# Patient Record
Sex: Female | Born: 1990 | Hispanic: No | Marital: Single
Health system: Midwestern US, Community
[De-identification: ages and names within clinical notes are randomized; demographics above are authoritative.]

## PROBLEM LIST (undated history)

## (undated) DIAGNOSIS — Z3A09 9 weeks gestation of pregnancy: Secondary | ICD-10-CM

## (undated) DIAGNOSIS — O3680X0 Pregnancy with inconclusive fetal viability, not applicable or unspecified: Secondary | ICD-10-CM

## (undated) DIAGNOSIS — O24419 Gestational diabetes mellitus in pregnancy, unspecified control: Secondary | ICD-10-CM

## (undated) DIAGNOSIS — G932 Benign intracranial hypertension: Secondary | ICD-10-CM

## (undated) DIAGNOSIS — N6310 Unspecified lump in the right breast, unspecified quadrant: Secondary | ICD-10-CM

## (undated) HISTORY — DX: Gestational diabetes mellitus in pregnancy, unspecified control: O24.419

## (undated) HISTORY — DX: Benign intracranial hypertension: G93.2

## (undated) HISTORY — PX: LUMBAR PUNCTURE: SHX1985

---
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
## 2012-08-19 LAB — KOH, OTHER SOURCES: KOH: NONE SEEN

## 2012-08-19 LAB — URINALYSIS W/ REFLEX CULTURE
Bilirubin: NEGATIVE
Glucose: NEGATIVE mg/dL
Nitrites: NEGATIVE
Protein: 30 mg/dL — AB
RBC: >100 /hpf — ABNORMAL HIGH (ref 0–5)
Specific gravity: 1.021 (ref 1.003–1.030)
Urobilinogen: 1 EU/dL (ref 0.2–1.0)
pH (UA): 7.5 (ref 5.0–8.0)

## 2012-08-19 LAB — METABOLIC PANEL, COMPREHENSIVE
A-G Ratio: 1 — ABNORMAL LOW (ref 1.1–2.2)
ALT (SGPT): 34 U/L (ref 12–78)
AST (SGOT): 20 U/L (ref 15–37)
Albumin: 3.6 g/dL (ref 3.5–5.0)
Alk. phosphatase: 112 U/L (ref 50–136)
Anion gap: 11 mmol/L (ref 5–15)
BUN/Creatinine ratio: 14 (ref 12–20)
BUN: 6 MG/DL (ref 6–20)
Bilirubin, total: 0.6 MG/DL (ref 0.2–1.0)
CO2: 26 mmol/L (ref 21–32)
Calcium: 9 MG/DL (ref 8.5–10.1)
Chloride: 103 mmol/L (ref 97–108)
Creatinine: 0.43 MG/DL — ABNORMAL LOW (ref 0.45–1.15)
GFR est AA: >60 mL/min/{1.73_m2} (ref >60)
GFR est non-AA: >60 mL/min/{1.73_m2} (ref >60)
Globulin: 3.7 g/dL (ref 2.0–4.0)
Glucose: 83 mg/dL (ref 65–100)
Potassium: 3.9 mmol/L (ref 3.5–5.1)
Protein, total: 7.3 g/dL (ref 6.4–8.2)
Sodium: 140 mmol/L (ref 136–145)

## 2012-08-19 LAB — WET PREP: Wet prep: NONE SEEN

## 2012-08-19 LAB — LIPASE: Lipase: 118 U/L (ref 73–393)

## 2012-08-19 LAB — HCG URINE, QL. - POC: Pregnancy test,urine (POC): NEGATIVE

## 2012-08-19 MED ORDER — PANTOPRAZOLE 40 MG TAB, DELAYED RELEASE
40 mg | ORAL_TABLET | Freq: Every day | ORAL | Status: AC
Start: 2012-08-19 — End: 2012-09-08

## 2012-08-19 MED ORDER — PANTOPRAZOLE 40 MG TAB, DELAYED RELEASE
40 mg | ORAL | Status: AC
Start: 2012-08-19 — End: 2012-08-19
  Administered 2012-08-19: 21:00:00 via ORAL

## 2012-08-19 MED ORDER — NITROFURANTOIN (25% MACROCRYSTAL FORM) 100 MG CAP
100 mg | ORAL_CAPSULE | Freq: Two times a day (BID) | ORAL | Status: AC
Start: 2012-08-19 — End: 2012-08-26

## 2012-08-19 MED ORDER — FLUCONAZOLE 150 MG TAB
150 mg | ORAL_TABLET | Freq: Every day | ORAL | Status: AC
Start: 2012-08-19 — End: 2012-08-20

## 2012-08-19 MED ORDER — METRONIDAZOLE 500 MG TAB
500 mg | ORAL_TABLET | Freq: Two times a day (BID) | ORAL | Status: AC
Start: 2012-08-19 — End: 2012-08-26

## 2012-08-19 MED ORDER — IBUPROFEN 600 MG TAB
600 mg | ORAL_TABLET | Freq: Four times a day (QID) | ORAL | Status: DC | PRN
Start: 2012-08-19 — End: 2018-10-12

## 2012-08-19 MED ORDER — ONDANSETRON HCL 4 MG TAB
4 mg | ORAL_TABLET | Freq: Three times a day (TID) | ORAL | Status: DC | PRN
Start: 2012-08-19 — End: 2018-10-30

## 2012-08-19 NOTE — ED Notes (Signed)
Patient stuck twice for labs. Unable to obtain all labs ordered by MD. Patient refuses to be stuck again. MD notified.

## 2012-08-19 NOTE — ED Provider Notes (Signed)
HPI Comments: 22 y.o. female presents ambulatory to [facility] ED with cc of intermittent vaginal bleeding x 2 weeks. Pt states she is unsure if she is on MP, and reports associated 7/10 diffuse upper Abd radiating to her lower Abd. Pt states Abd pain is occasionally exacerbated by eating "but I'm not really sure." Pt notes she has a 62 year old child and 31 month old child. She specifically denies any fevers, chills, nausea, vomiting, chest pain, shortness of breath, headache, rash, diarrhea, sweating or weight loss.    PCP: Marlowe Aschoff, MD    PMHx significant for: asthma  PSHx significant for: pt denies  Social hx: smoker    There are no other complaints, changes or physical findings at this time.   Written by Marcell Anger, ED Scribe, as dictated by Odis Hollingshead Dorothey Baseman, MD.      The history is provided by the patient.        Past Medical History   Diagnosis Date   ??? Asthma    ??? ADHD (attention deficit hyperactivity disorder) 2010     not currently taking medication        History reviewed. No pertinent past surgical history.      Family History   Problem Relation Age of Onset   ??? Sickle Cell Anemia Mother    ??? Asthma Sister    ??? Asthma Brother      bronnchitis   ??? Hypertension Maternal Grandmother    ??? Breast Cancer Maternal Grandmother      breast ca   ??? Asthma Maternal Grandmother    ??? Hypertension Maternal Grandfather    ??? Kidney Disease Maternal Grandfather    ??? Asthma Sister    ??? Asthma Sister         History     Social History   ??? Marital Status: SINGLE     Spouse Name: N/A     Number of Children: N/A   ??? Years of Education: N/A     Occupational History   ??? Not on file.     Social History Main Topics   ??? Smoking status: Current Some Day Smoker   ??? Smokeless tobacco: Never Used   ??? Alcohol Use: No   ??? Drug Use: No   ??? Sexually Active: Yes -- Female partner(s)     Other Topics Concern   ??? Not on file     Social History Narrative   ??? No narrative on file                  ALLERGIES: Seafood      Review of Systems    Constitutional: Negative.  Negative for fever, chills, diaphoresis and unexpected weight change.   HENT: Negative.    Eyes: Negative.    Respiratory: Negative.  Negative for shortness of breath.    Cardiovascular: Negative.  Negative for chest pain.   Gastrointestinal: Positive for abdominal pain (upper). Negative for nausea, vomiting and diarrhea.   Genitourinary: Positive for vaginal bleeding.   Musculoskeletal: Negative.    Skin: Negative.  Negative for rash.   Neurological: Negative.  Negative for headaches.   All other systems reviewed and are negative.        Filed Vitals:    08/19/12 1628   BP: 130/80   Pulse: 93   Temp: 97.7 ??F (36.5 ??C)   Resp: 20   Height: 5\' 4"  (1.626 m)   Weight: 122.925 kg (271 lb)   SpO2: 97%  Physical Exam   Nursing note and vitals reviewed.  Physical Examination: General appearance - WDWN, in no apparent distress, obese  Head - NC/AT  Eyes - pupils equal, round  and reactive, extraocular eye movements intact, conj/sclera clear, anicteric  Mouth - mucous membranes moist, pharynx normal without lesions  Nose/Ears - nares clear, Tms & canals clear  Neck - supple, no significant adenopathy, trachea midline, no crepitus, c spine diffusely non-tender, no step offs  Chest - Normal respiratory effort, clear to auscultation bilaterally, no wheezes/rales/rhonchi  Heart - normal rate and regular rhythm, S1 and S2 normal, no murmurs, gallops, or rubs  Abdomen - soft. Mid-abd, RUQ, and RLQ TTP. nondistended, nabs, no masses, guarding, rebound or rigidity  Neurological - alert, oriented, normal speech, cranial nerves intact, no focal motor findings, motor & sensory diffusely intact, normal gait  Extremities - peripheral pulses normal, no pedal edema, all joints atraumatic, FROM, non-tender, no gross deformities, spine diffusely non-tender  Skin - normal coloration and turgor, no rashes, no lesions or lacerations  Written by Marcell Anger, ED Scribe, as dictated by Odis Hollingshead.  Dorothey Baseman, MD.      MDM     Differential Diagnosis; Clinical Impression; Plan:     DDx: gallstones, appendicitis, menorrhagia, UTI, PID  Amount and/or Complexity of Data Reviewed:   Clinical lab tests:  Ordered and reviewed  Tests in the radiology section of CPT??:  Ordered and reviewed  Progress:   Patient progress:  Stable      Procedures    Procedure Note - Pelvic Exam:    5:16 PM  Performed by: Odis Hollingshead Dorothey Baseman, MD  Pelvic exam was performed using bimanual and speculum. Vaginal bleeding consistent with normal menses. No other findings.   The procedure took 1-15 minutes, and pt tolerated well.  Written by Marcell Anger, ED Scribe, as dictated by Odis Hollingshead Dorothey Baseman, MD.    6:06 PM   Pt allowed some labs to be drawn but refused CBC.   Written by Marcell Anger, ED Scribe, as dictated by Odis Hollingshead Dorothey Baseman, MD.    LABORATORY TESTS:  Recent Results (from the past 12 hour(s))   URINALYSIS W/ REFLEX CULTURE    Collection Time     08/19/12  4:30 PM       Result Value Range    Color RED      Appearance TURBID (*) CLEAR    Specific gravity 1.021  1.003 - 1.030      pH 7.5  5.0 - 8.0      Protein 30 (*) NEGATIVE mg/dL    Glucose NEGATIVE   NEGATIVE mg/dL    Ketone TRACE (*) NEGATIVE mg/dL    Bilirubin NEGATIVE   NEGATIVE    Blood LARGE (*) NEGATIVE    Urobilinogen 1.0  0.2 - 1.0 EU/dL    Nitrites NEGATIVE   NEGATIVE    Leukocyte Esterase MODERATE (*) NEGATIVE    WBC 20-50  0 - 4 /hpf    RBC >100 (*) 0 - 5 /hpf    Epithelial cells MANY (*) FEW /lpf    Bacteria 3+ (*) NEGATIVE /hpf    UA:UC IF INDICATED URINE CULTURE ORDERED (*) CULTURE NOT INDICATE    Yeast PRESENT (*) NEGATIVE   HCG URINE, QL. - POC    Collection Time     08/19/12  5:10 PM       Result Value Range    Pregnancy test,urine (POC) NEGATIVE   NEGATIVE   METABOLIC PANEL, COMPREHENSIVE  Collection Time     08/19/12  5:28 PM       Result Value Range    Sodium 140  136 - 145 mmol/L    Potassium 3.9  3.5 - 5.1 mmol/L    Chloride 103  97 - 108  mmol/L    CO2 26  21 - 32 mmol/L    Anion gap 11  5 - 15 mmol/L    Glucose 83  65 - 100 mg/dL    BUN 6  6 - 20 MG/DL    Creatinine 1.61 (*) 0.45 - 1.15 MG/DL    BUN/Creatinine ratio 14  12 - 20      GFR est AA >60  >60 ml/min/1.59m2    GFR est non-AA >60  >60 ml/min/1.67m2    Calcium 9.0  8.5 - 10.1 MG/DL    Bilirubin, total 0.6  0.2 - 1.0 MG/DL    ALT 34  12 - 78 U/L    AST 20  15 - 37 U/L    Alk. phosphatase 112  50 - 136 U/L    Protein, total 7.3  6.4 - 8.2 g/dL    Albumin 3.6  3.5 - 5.0 g/dL    Globulin 3.7  2.0 - 4.0 g/dL    A-G Ratio 1.0 (*) 1.1 - 2.2     LIPASE    Collection Time     08/19/12  5:28 PM       Result Value Range    Lipase 118  73 - 393 U/L   KOH, OTHER SOURCES    Collection Time     08/19/12  5:30 PM       Result Value Range    Specimen Description: KOH      Special Requests: NO SPECIAL REQUESTS      KOH NO YEAST SEEN      Report Status 08/19/2012 FINAL     WET PREP    Collection Time     08/19/12  5:30 PM       Result Value Range    Clue cells CLUE CELLS PRESENT      Wet prep NO TRICHOMONAS SEEN         IMAGING RESULTS:  CT ABD PELV WO CONT (Final result)  Result time: 08/19/12 18:05:47      Final result by Rad Results In Edi (08/19/12 18:05:47)      Narrative:    **Final Report**      ICD Codes / Adm.Diagnosis: 110002 120015 / Abdominal Pain Vaginal Bleeding  Examination: CT ABD PELVIS WO CON - [id number] - Aug 19 2012 5:42PM  Accession No: [id number]  Reason: Right Abd Pain/KS      REPORT:  EXAM: CT ABDOMEN PELVIS WITHOUT CONTRAST  INDICATION: Right Abd Pain/KS  COMPARISON: None.  .  TECHNIQUE:  Unenhanced multislice helical CT was performed from the diaphragm to the   symphysis pubis without oral or intravenous contrast administration.   Contiguous 5 mm axial images were reconstructed and lung and soft tissue   windows were generated. Coronal and sagittal reformations were generated.  Marland Kitchen  FINDINGS:  CHEST:  Heart/vessels: Within normal limits.  Lungs/Pleura: 0.5 cm nodule in the right base.  .   ABDOMEN:  Liver: Within normal limits.  Gallbladder/Biliary: Within normal limits.  Spleen: Within normal limits.  Pancreas: Within normal limits.  Adrenals: Within normal limits.  Kidneys: Within normal limits.  Peritoneum/Mesenteries: Within normal limits.  Extraperitoneum: Within normal limits.  Gastrointestinal tract: Within normal limits.  Normal-appearing appendix.  Vascular: Within normal limits.  Marland Kitchen  PELVIS:  Extraperitoneum: Multiple calcifications in the floor the pelvis suggesting   phleboliths.  Ureters: The ureters are not well visualized; however, there is no   visualized abnormality.  Bladder: Within normal limits.  Reproductive System: Within normal limits.  .  MSK: Within normal limits.  .     IMPRESSION:  1. No CT explanation for the patient's abdominal pain.  2. There is a 0.5 cm nodule in the right base. Guidelines by the Fleischner   society (Radiology 2005: [phone]) suggest that patients with low risk   for lung cancer and nodules greater than 4 mm and less than or equal to 6 mm   in diameter should have follow up in 12 months. In patients with a higher   risk, such as smokers, follow-up is recommended in 6 months. Patients with   a known malignancy are at increased risk for metastasis and should receive a   three month follow-up.    Cam Hai Doctor: Elsie Saas [phone])   ApprovedEphriam Knuckles SHIELD [phone]) Aug 19 2012 6:03PM            MEDICATIONS GIVEN:  Medications   pantoprazole (PROTONIX) tablet 40 mg (40 mg Oral Given 08/19/12 1727)       IMPRESSION:  1. DUB (dysfunctional uterine bleeding)    2. UTI (urinary tract infection)    3. Vaginal yeast infection    4. BV (bacterial vaginosis)        PLAN:  1. Diflucan  2. Macrobid  3. Zofran  4. Protonix  5. Motrin  6. F/U with PCP as needed  Return to ED if worse     DISCHARGE NOTE  6:40 PM  The patient has been re-evaluated and is ready for discharge. Reviewed available results with patient. Counseled pt on diagnosis  and care plan. Pt has expressed understanding, and all questions have been answered. Pt agrees with plan and agrees to F/U as recommended, or return to the ED if their sxs worsen. Discharge instructions have been provided and explained to the pt, along with reasons to return to the ED.  Written by Marcell Anger, ED Scribe, as dictated by Odis Hollingshead Dorothey Baseman, MD.

## 2012-08-19 NOTE — ED Notes (Signed)
Patient presents to ED with c/o nausea, "all over abdominal pain", and "off and on" vaginal bleeding x"a while". Patient states "It's not like I'm on my period. I bleed for a few days and then it stop and then it starts again".  Patient denies SOB or fevers. Patient is sitting in bed in gown. Patient in no apparent distress. Will continue to monitor patient.

## 2012-08-19 NOTE — ED Notes (Signed)
Patient (s)  given copy of dc instructions and 6 script(s).  Patient (s)  verbalized understanding of instructions and script (s).  Patient given a current medication reconciliation form and verbalized understanding of their medications.   Patient (s) verbalized understanding of the importance of discussing medications with  his or her physician or clinic they will be following up with.  Patient alert and oriented and in no acute distress.  Patient discharged home ambulatory with self.

## 2012-08-21 LAB — CHLAMYDIA/GC PCR
Chlamydia amplified: NEGATIVE
N. gonorrhea, amplified: NEGATIVE

## 2012-08-21 LAB — CULTURE, URINE
Colonies Counted: >100000
Colony Count: >100000

## 2012-10-14 NOTE — ED Provider Notes (Signed)
HPI Comments: Wendy Bell is a 22 y.o. female w/ a significant hx of shellfish allergy who presents ambulatory to the ED with a c/o an allergic reaction to shrimp w/ associated throat itching, swelling, and wheezing x PTA tonight. Pt reports her boyfriend cooked shrimp this evening, and immediately after the shrimp was cooked, pt began w/ the sx's. Pt denies touching or eating the shrimp. Pt cites taking "1 drop" of her daughter's Prednisolone (secondary to it "tasting bad") and using Albuterol tx x PTA. Pt states her only current sx is throat itchiness. Pt denies any difficulty speaking.    LNMP: Pt believes her LNMP was x the beginning of last month.     PCP: Marlowe Aschoff, MD   ALL: Seafood    There are no further complaints or symptoms at this time. 11:58 PM   Written by Gregary Signs, ED Scribe, as dictated by Job Holtsclaw N. Jumaane Weatherford, MD.       The history is provided by the patient.        Past Medical History   Diagnosis Date   ??? Asthma    ??? ADHD (attention deficit hyperactivity disorder) 2010     not currently taking medication        History reviewed. No pertinent past surgical history.      Family History   Problem Relation Age of Onset   ??? Sickle Cell Anemia Mother    ??? Asthma Sister    ??? Asthma Brother      bronnchitis   ??? Hypertension Maternal Grandmother    ??? Breast Cancer Maternal Grandmother      breast ca   ??? Asthma Maternal Grandmother    ??? Hypertension Maternal Grandfather    ??? Kidney Disease Maternal Grandfather    ??? Asthma Sister    ??? Asthma Sister         History     Social History   ??? Marital Status: SINGLE     Spouse Name: N/A     Number of Children: N/A   ??? Years of Education: N/A     Occupational History   ??? Not on file.     Social History Main Topics   ??? Smoking status: Current Some Day Smoker   ??? Smokeless tobacco: Never Used   ??? Alcohol Use: No   ??? Drug Use: No   ??? Sexually Active: Yes -- Female partner(s)     Other Topics Concern   ??? Not on file     Social History Narrative   ??? No narrative  on file                  ALLERGIES: Seafood      Review of Systems   Constitutional: Negative.    HENT: Positive for trouble swallowing (Throat itchy, swollen- see HPI). Negative for voice change.    Eyes: Negative.    Respiratory: Positive for wheezing.    Cardiovascular: Negative.    Gastrointestinal: Negative.    Endocrine: Negative.    Genitourinary: Negative.    Musculoskeletal: Negative.    Skin: Negative.    Allergic/Immunologic: Negative.    Neurological: Negative.    Hematological: Negative.    Psychiatric/Behavioral: Negative.    All other systems reviewed and are negative.        Filed Vitals:    10/14/12 2315   BP: 161/83   Pulse: 78   Temp: 98 ??F (36.7 ??C)   Resp: 18   Height: 5'  4" (1.626 m)   Weight: 113.399 kg (250 lb)   SpO2: 91%            Physical Exam     General appearance - obese, well appearing, voice normal, and in no distress  Eyes - pupils equal and reactive, extraocular eye movements intact  ENT - mucous membranes moist, pharynx normal without lesions, no tonsillary hypertrophy, no tongue/lip swelling  Neck - supple, no significant adenopathy; non-tender to palpation  Chest - clear to auscultation, no wheezes, rales or rhonchi; non-tender to palpation  Heart - normal rate and regular rhythm, S1 and S2 normal, no murmurs noted  Abdomen - soft, nontender, nondistended, no masses or organomegaly  Musculoskeletal - no joint tenderness, deformity or swelling; normal ROM  Extremities - peripheral pulses normal, no pedal edema  Skin - normal coloration and turgor, no rashes  Neurological - alert, oriented x3, normal speech, no focal findings or movement disorder noted  Written by Gregary Signs, ED Scribe, as dictated by Burdell Peed N. Rafay Dahan, MD.     MDM     Amount and/or Complexity of Data Reviewed:   Clinical lab tests:  Ordered and reviewed   Independant visualization of image, tracing, or specimen:  Yes  Progress:   Patient progress:  Stable      Procedures    1:33 AM  Pt is sleeping comfortably  in ED bed. Upon being woken up pt states she is feeling better and is ready to go home. Pt has no additional questions or concerns.  Written by Jackelyn Hoehn, ED Scribe, as dictated by Sharmila Wrobleski N. Welma Mccombs, MD.    LABORATORY TESTS:  Recent Results (from the past 12 hour(s))   HCG URINE, QL. - POC    Collection Time     10/15/12 12:14 AM       Result Value Range    Pregnancy test,urine (POC) NEGATIVE   NEGATIVE       IMAGING RESULTS:    MEDICATIONS GIVEN:  Medications   dexamethasone (DECADRON) 4 mg/mL injection 10 mg (10 mg IntraVENous Given 10/15/12 0012)   albuterol (PROVENTIL VENTOLIN) nebulizer solution 5 mg (5 mg Nebulization Given 10/15/12 0013)   famotidine (PF) (PEPCID) injection 20 mg (20 mg IntraVENous Given 10/15/12 0013)       IMPRESSION:  1. Allergic reaction        PLAN:  1. Sterapred  2. Pepcid  3. Benadryl  4. F/U with PCP  Return to ED if worse     1:34 AM  Wendy Bell's  results have been reviewed with her.  She has been counseled regarding her diagnosis.  She verbally conveys understanding and agreement of the signs, symptoms, diagnosis, treatment and prognosis and additionally agrees to follow up as recommended with Dr. Marlowe Aschoff, MD in 24 - 48 hours.  She also agrees with the care-plan and conveys that all of her questions have been answered.    Written by Jackelyn Hoehn, ED Scribe, as dictated by Tamilyn Lupien N. Zakira Ressel, MD.

## 2012-10-15 LAB — HCG URINE, QL. - POC: Pregnancy test,urine (POC): NEGATIVE

## 2012-10-15 MED ORDER — PREDNISONE 10 MG TABLETS IN A DOSE PACK
10 mg | ORAL_TABLET | ORAL | Status: DC
Start: 2012-10-15 — End: 2018-10-12

## 2012-10-15 MED ORDER — FAMOTIDINE (PF) 20 MG/2 ML IV
20 mg/2 mL | INTRAVENOUS | Status: AC
Start: 2012-10-15 — End: 2012-10-15
  Administered 2012-10-15: 04:00:00 via INTRAVENOUS

## 2012-10-15 MED ORDER — ALBUTEROL SULFATE 0.083 % (0.83 MG/ML) SOLN FOR INHALATION
2.5 mg /3 mL (0.083 %) | RESPIRATORY_TRACT | Status: AC
Start: 2012-10-15 — End: 2012-10-15
  Administered 2012-10-15: 04:00:00 via RESPIRATORY_TRACT

## 2012-10-15 MED ORDER — DEXAMETHASONE SODIUM PHOSPHATE 4 MG/ML IJ SOLN
4 mg/mL | INTRAMUSCULAR | Status: AC
Start: 2012-10-15 — End: 2012-10-15
  Administered 2012-10-15: 04:00:00 via INTRAVENOUS

## 2012-10-15 MED ORDER — DIPHENHYDRAMINE 25 MG CAP
25 mg | ORAL_CAPSULE | Freq: Four times a day (QID) | ORAL | Status: AC | PRN
Start: 2012-10-15 — End: 2012-10-25

## 2012-10-15 MED ORDER — FAMOTIDINE 20 MG TAB
20 mg | ORAL_TABLET | Freq: Two times a day (BID) | ORAL | Status: AC
Start: 2012-10-15 — End: ?

## 2012-10-15 NOTE — ED Notes (Signed)
Pt poc preg neg. Pt reports feeling better.

## 2012-10-15 NOTE — ED Notes (Signed)
Attempted to find pt a ride home with [employer], pt is not active in the system. Pt reports she has someone to pick her up. Pt ambulatory with a steady gait to waiting room, NAD.

## 2015-04-08 LAB — OB RESULTS CONSOLE HGB/HCT, BLOOD
HCT: 36 %
HEMOGLOBIN: 12.1 g/dL

## 2015-04-08 LAB — OB RESULTS CONSOLE PLATELET COUNT: Platelets: 200 10*3/uL

## 2015-04-16 ENCOUNTER — Encounter: Payer: Self-pay | Admitting: *Deleted

## 2015-04-16 DIAGNOSIS — O0990 Supervision of high risk pregnancy, unspecified, unspecified trimester: Secondary | ICD-10-CM

## 2015-04-16 DIAGNOSIS — Z8632 Personal history of gestational diabetes: Secondary | ICD-10-CM

## 2015-04-16 DIAGNOSIS — K219 Gastro-esophageal reflux disease without esophagitis: Secondary | ICD-10-CM | POA: Insufficient documentation

## 2015-04-16 DIAGNOSIS — Z5900 Homelessness unspecified: Secondary | ICD-10-CM | POA: Insufficient documentation

## 2015-04-16 DIAGNOSIS — O09299 Supervision of pregnancy with other poor reproductive or obstetric history, unspecified trimester: Secondary | ICD-10-CM

## 2015-04-27 ENCOUNTER — Ambulatory Visit (INDEPENDENT_AMBULATORY_CARE_PROVIDER_SITE_OTHER): Payer: Medicaid Other | Admitting: Family Medicine

## 2015-04-27 ENCOUNTER — Encounter: Payer: Self-pay | Admitting: Family Medicine

## 2015-04-27 ENCOUNTER — Encounter: Payer: Self-pay | Admitting: *Deleted

## 2015-04-27 VITALS — BP 114/62 | HR 98 | Temp 98.2°F | Ht 64.0 in | Wt 265.0 lb

## 2015-04-27 DIAGNOSIS — O3429 Maternal care due to uterine scar from other previous surgery: Secondary | ICD-10-CM | POA: Diagnosis not present

## 2015-04-27 DIAGNOSIS — Z23 Encounter for immunization: Secondary | ICD-10-CM

## 2015-04-27 DIAGNOSIS — Z8759 Personal history of other complications of pregnancy, childbirth and the puerperium: Secondary | ICD-10-CM | POA: Diagnosis not present

## 2015-04-27 DIAGNOSIS — O24419 Gestational diabetes mellitus in pregnancy, unspecified control: Secondary | ICD-10-CM | POA: Diagnosis not present

## 2015-04-27 DIAGNOSIS — O0000 Abdominal pregnancy without intrauterine pregnancy: Secondary | ICD-10-CM

## 2015-04-27 DIAGNOSIS — O34219 Maternal care for unspecified type scar from previous cesarean delivery: Secondary | ICD-10-CM

## 2015-04-27 DIAGNOSIS — G932 Benign intracranial hypertension: Secondary | ICD-10-CM

## 2015-04-27 DIAGNOSIS — O0990 Supervision of high risk pregnancy, unspecified, unspecified trimester: Secondary | ICD-10-CM

## 2015-04-27 DIAGNOSIS — O09299 Supervision of pregnancy with other poor reproductive or obstetric history, unspecified trimester: Secondary | ICD-10-CM | POA: Insufficient documentation

## 2015-04-27 DIAGNOSIS — O09293 Supervision of pregnancy with other poor reproductive or obstetric history, third trimester: Secondary | ICD-10-CM

## 2015-04-27 LAB — POCT URINALYSIS DIP (DEVICE)
GLUCOSE, UA: NEGATIVE mg/dL
Hgb urine dipstick: NEGATIVE
KETONES UR: 15 mg/dL — AB
Nitrite: NEGATIVE
PROTEIN: NEGATIVE mg/dL
SPECIFIC GRAVITY, URINE: 1.02 (ref 1.005–1.030)
Urobilinogen, UA: 0.2 mg/dL (ref 0.0–1.0)
pH: 7 (ref 5.0–8.0)

## 2015-04-27 LAB — CBC
HEMATOCRIT: 37 % (ref 36.0–46.0)
Hemoglobin: 12.5 g/dL (ref 12.0–15.0)
MCH: 28.7 pg (ref 26.0–34.0)
MCHC: 33.8 g/dL (ref 30.0–36.0)
MCV: 85.1 fL (ref 78.0–100.0)
MPV: 12.8 fL — AB (ref 8.6–12.4)
Platelets: 201 10*3/uL (ref 150–400)
RBC: 4.35 MIL/uL (ref 3.87–5.11)
RDW: 13.8 % (ref 11.5–15.5)
WBC: 8.7 10*3/uL (ref 4.0–10.5)

## 2015-04-27 LAB — RPR

## 2015-04-27 MED ORDER — ACCU-CHEK NANO SMARTVIEW W/DEVICE KIT: 1.0000 [IU] | PACK | Freq: Once | Status: AC

## 2015-04-27 MED ORDER — ACETAZOLAMIDE 125 MG PO TABS: 125.0000 mg | ORAL_TABLET | Freq: Two times a day (BID) | ORAL | Status: DC

## 2015-04-27 MED ORDER — ACCU-CHEK FASTCLIX LANCETS MISC: 1.0000 [IU] | Freq: Three times a day (TID) | Status: AC

## 2015-04-27 MED ORDER — GLUCOSE BLOOD VI STRP: ORAL_STRIP | Status: AC

## 2015-04-27 MED ORDER — TETANUS-DIPHTH-ACELL PERTUSSIS 5-2.5-18.5 LF-MCG/0.5 IM SUSP
0.5000 mL | Freq: Once | INTRAMUSCULAR | Status: AC
  Administered 2015-04-27: 0.5 mL via INTRAMUSCULAR

## 2015-04-27 NOTE — Progress Notes (Signed)
Patient reports contraction like pains every night for past 3 nights- has not slept

## 2015-04-27 NOTE — Progress Notes (Signed)
Subjective:  Sharon Santana is a 24 y.o. G4P3004 at 2867w6d being seen today for ongoing prenatal care.  She is currently monitored for the following issues for this high-risk pregnancy: Patient Active Problem List   Diagnosis Date Noted  . Gestational diabetes mellitus (GDM), antepartum 04/27/2015  . Hx of preeclampsia, prior pregnancy, currently pregnant 04/27/2015  . History of twin pregnancy in prior pregnancy 04/27/2015  . Pseudotumor cerebri 04/27/2015  . Uterine scar from previous cesarean delivery affecting pregnancy 04/27/2015  . Supervision of high risk pregnancy, antepartum 04/16/2015  . History of gestational diabetes in prior pregnancy, currently pregnant 04/16/2015  . Homeless family 04/16/2015  . GERD (gastroesophageal reflux disease) 04/16/2015   Patient reports headache.  Contractions: Irregular. Vag. Bleeding: None.  Movement: Present. Denies leaking of fluid.   The following portions of the patient's history were reviewed and updated as appropriate: allergies, current medications, past family history, past medical history, past social history, past surgical history and problem list. Problem list updated.  Objective:   Filed Vitals:   04/27/15 1358 04/27/15 1358  BP: 114/62   Pulse: 98   Temp: 98.2 F (36.8 C)   Height:  5\' 4"  (1.626 m)  Weight: 265 lb (120.203 kg)     Fetal Status: Fetal Heart Rate (bpm): 138   Movement: Present     General:  Alert, oriented and cooperative. Patient is in no acute distress.  Skin: Skin is warm and dry. No rash noted.   Cardiovascular: Normal heart rate noted  Respiratory: Normal respiratory effort, no problems with respiration noted  Abdomen: Soft, gravid, appropriate for gestational age. Pain/Pressure: Present     Pelvic: Vag. Bleeding: None     Cervical exam deferred        Extremities: Normal range of motion.  Edema: Trace  Mental Status: Normal mood and affect. Normal behavior. Normal judgment and thought content.    Urinalysis: Urine Protein: Negative Urine Glucose: Negative  Assessment and Plan:  Pregnancy: G4P3004 at 4867w6d  1. Gestational diabetes mellitus (GDM), antepartum, gestational diabetes method of control unspecified - Not checking sugars this whole pregnancy, measuring greater than dates.  - Rx for glucometer, strips, lancets sent to pharmacy - Instructed on testing Fasting and 2 hrs after eating for 1 week - Return when DM education here - POCT Glucose (CBG)  2. Hx of preeclampsia, prior pregnancy, currently pregnant, third trimester -BP today is wnl -Likely no benefit to ASA given late gestational age  413. History of twin pregnancy in prior pregnancy- 2015  4. Pseudotumor cerebri Daily HA currently. Patient reports blurry vision which attributes to pseudo tumor and I tend to agree. I reviewed Pregnancy Class C of diamox and risk of potential harm but not proven harm. Patient voiced understanding. We discussed the risk of potential harm weighed with intensity of HA which at this point patient feels is worth treatment. Sent in Rx for diamox and referral to neurology for follow up - acetaZOLAMIDE (DIAMOX) 125 MG tablet; Take 1 tablet (125 mg total) by mouth 2 (two) times daily.  Dispense: 60 tablet; Refill: 3 - Ambulatory referral to Neurology  5. Supervision of high risk pregnancy, antepartum, unspecified trimester - Add pregnancy box - Patient needs to be seen by SW given homeless  -CBC - RPR - HIV antibody (with reflex) - Culture, OB Urine - Prescript Monitor Profile(19) - Tdap (BOOSTRIX) injection 0.5 mL; Inject 0.5 mLs into the muscle once. - US MFM OB DETAIL +14 WK; Future  6. Uterine  scar from previous cesarean delivery affecting pregnancy TOLAC consent signed 04/27/2015   Preterm labor symptoms and general obstetric precautions including but not limited to vaginal bleeding, contractions, leaking of fluid and fetal movement were reviewed in detail with the  patient. Please refer to After Visit Summary for other counseling recommendations.  Return in about 2 weeks (around 05/11/2015) for Routine prenatal care.   Federico Flake, MD

## 2015-04-27 NOTE — Patient Instructions (Addendum)
For Sleep and Pain 1) Take Tylenol  every 5 hours as needed for pain 2) Take Unisom (which is over the counter) at night to help with sleep 3) Take warm baths 4) use pillows to support belly  Third Trimester of Pregnancy The third trimester is from week 29 through week 42, months 7 through 9. The third trimester is a time when the fetus is growing rapidly. At the end of the ninth month, the fetus is about 20 inches in length and weighs 6-10 pounds.  BODY CHANGES Your body goes through many changes during pregnancy. The changes vary from woman to woman.   Your weight will continue to increase. You can expect to gain 25-35 pounds (11-16 kg) by the end of the pregnancy.  You may begin to get stretch marks on your hips, abdomen, and breasts.  You may urinate more often because the fetus is moving lower into your pelvis and pressing on your bladder.  You may develop or continue to have heartburn as a result of your pregnancy.  You may develop constipation because certain hormones are causing the muscles that push waste through your intestines to slow down.  You may develop hemorrhoids or swollen, bulging veins (varicose veins).  You may have pelvic pain because of the weight gain and pregnancy hormones relaxing your joints between the bones in your pelvis. Backaches may result from overexertion of the muscles supporting your posture.  You may have changes in your hair. These can include thickening of your hair, rapid growth, and changes in texture. Some women also have hair loss during or after pregnancy, or hair that feels dry or thin. Your hair will most likely return to normal after your baby is born.  Your breasts will continue to grow and be tender. A yellow discharge may leak from your breasts called colostrum.  Your belly button may stick out.  You may feel short of breath because of your expanding uterus.  You may notice the fetus "dropping," or moving lower in your  abdomen.  You may have a bloody mucus discharge. This usually occurs a few days to a week before labor begins.  Your cervix becomes thin and soft (effaced) near your due date. WHAT TO EXPECT AT YOUR PRENATAL EXAMS  You will have prenatal exams every 2 weeks until week 36. Then, you will have weekly prenatal exams. During a routine prenatal visit:  You will be weighed to make sure you and the fetus are growing normally.  Your blood pressure is taken.  Your abdomen will be measured to track your baby's growth.  The fetal heartbeat will be listened to.  Any test results from the previous visit will be discussed.  You may have a cervical check near your due date to see if you have effaced. At around 36 weeks, your caregiver will check your cervix. At the same time, your caregiver will also perform a test on the secretions of the vaginal tissue. This test is to determine if a type of bacteria, Group B streptococcus, is present. Your caregiver will explain this further. Your caregiver may ask you:  What your birth plan is.  How you are feeling.  If you are feeling the baby move.  If you have had any abnormal symptoms, such as leaking fluid, bleeding, severe headaches, or abdominal cramping.  If you are using any tobacco products, including cigarettes, chewing tobacco, and electronic cigarettes.  If you have any questions. Other tests or screenings that may be performed  during your third trimester include:  Blood tests that check for low iron levels (anemia).  Fetal testing to check the health, activity level, and growth of the fetus. Testing is done if you have certain medical conditions or if there are problems during the pregnancy.  HIV (human immunodeficiency virus) testing. If you are at high risk, you may be screened for HIV during your third trimester of pregnancy. FALSE LABOR You may feel small, irregular contractions that eventually go away. These are called Braxton Hicks  contractions, or false labor. Contractions may last for hours, days, or even weeks before true labor sets in. If contractions come at regular intervals, intensify, or become painful, it is best to be seen by your caregiver.  SIGNS OF LABOR   Menstrual-like cramps.  Contractions that are 5 minutes apart or less.  Contractions that start on the top of the uterus and spread down to the lower abdomen and back.  A sense of increased pelvic pressure or back pain.  A watery or bloody mucus discharge that comes from the vagina. If you have any of these signs before the 37th week of pregnancy, call your caregiver right away. You need to go to the hospital to get checked immediately. HOME CARE INSTRUCTIONS   Avoid all smoking, herbs, alcohol, and unprescribed drugs. These chemicals affect the formation and growth of the baby.  Do not use any tobacco products, including cigarettes, chewing tobacco, and electronic cigarettes. If you need help quitting, ask your health care provider. You may receive counseling support and other resources to help you quit.  Follow your caregiver's instructions regarding medicine use. There are medicines that are either safe or unsafe to take during pregnancy.  Exercise only as directed by your caregiver. Experiencing uterine cramps is a good sign to stop exercising.  Continue to eat regular, healthy meals.  Wear a good support bra for breast tenderness.  Do not use hot tubs, steam rooms, or saunas.  Wear your seat belt at all times when driving.  Avoid raw meat, uncooked cheese, cat litter boxes, and soil used by cats. These carry germs that can cause birth defects in the baby.  Take your prenatal vitamins.  Take 1500-2000 mg of calcium daily starting at the 20th week of pregnancy until you deliver your baby.  Try taking a stool softener (if your caregiver approves) if you develop constipation. Eat more high-fiber foods, such as fresh vegetables or fruit and  whole grains. Drink plenty of fluids to keep your urine clear or pale yellow.  Take warm sitz baths to soothe any pain or discomfort caused by hemorrhoids. Use hemorrhoid cream if your caregiver approves.  If you develop varicose veins, wear support hose. Elevate your feet for 15 minutes, 3-4 times a day. Limit salt in your diet.  Avoid heavy lifting, wear low heal shoes, and practice good posture.  Rest a lot with your legs elevated if you have leg cramps or low back pain.  Visit your dentist if you have not gone during your pregnancy. Use a soft toothbrush to brush your teeth and be gentle when you floss.  A sexual relationship may be continued unless your caregiver directs you otherwise.  Do not travel far distances unless it is absolutely necessary and only with the approval of your caregiver.  Take prenatal classes to understand, practice, and ask questions about the labor and delivery.  Make a trial run to the hospital.  Pack your hospital bag.  Prepare the baby's nursery.  Continue to go to all your prenatal visits as directed by your caregiver. SEEK MEDICAL CARE IF:  You are unsure if you are in labor or if your water has broken.  You have dizziness.  You have mild pelvic cramps, pelvic pressure, or nagging pain in your abdominal area.  You have persistent nausea, vomiting, or diarrhea.  You have a bad smelling vaginal discharge.  You have pain with urination. SEEK IMMEDIATE MEDICAL CARE IF:   You have a fever.  You are leaking fluid from your vagina.  You have spotting or bleeding from your vagina.  You have severe abdominal cramping or pain.  You have rapid weight loss or gain.  You have shortness of breath with chest pain.  You notice sudden or extreme swelling of your face, hands, ankles, feet, or legs.  You have not felt your baby move in over an hour.  You have severe headaches that do not go away with medicine.  You have vision changes.    This information is not intended to replace advice given to you by your health care provider. Make sure you discuss any questions you have with your health care provider.   Document Released: 05/16/2001 Document Revised: 06/12/2014 Document Reviewed: 07/23/2012 Elsevier Interactive Patient Education [employer]2016 [employer]
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
## 2015-04-28 LAB — PRESCRIPTION MONITORING PROFILE (19 PANEL)
AMPHETAMINE/METH: NEGATIVE ng/mL
BARBITURATE SCREEN, URINE: NEGATIVE ng/mL
BENZODIAZEPINE SCREEN, URINE: NEGATIVE ng/mL
BUPRENORPHINE, URINE: NEGATIVE ng/mL
Cannabinoid Scrn, Ur: NEGATIVE ng/mL
Carisoprodol, Urine: NEGATIVE ng/mL
Cocaine Metabolites: NEGATIVE ng/mL
Creatinine, Urine: 225.3 mg/dL (ref >20.0)
Fentanyl, Ur: NEGATIVE ng/mL
MDMA URINE: NEGATIVE ng/mL
METHAQUALONE SCREEN (URINE): NEGATIVE ng/mL
Meperidine, Ur: NEGATIVE ng/mL
Methadone Screen, Urine: NEGATIVE ng/mL
NITRITES URINE, INITIAL: NEGATIVE ug/mL
OPIATE SCREEN, URINE: NEGATIVE ng/mL
Oxycodone Screen, Ur: NEGATIVE ng/mL
PROPOXYPHENE: NEGATIVE ng/mL
Phencyclidine, Ur: NEGATIVE ng/mL
TAPENTADOLUR: NEGATIVE ng/mL
TRAMADOL UR: NEGATIVE ng/mL
ZOLPIDEM, URINE: NEGATIVE ng/mL
pH, Initial: 7.5 pH (ref 4.5–8.9)

## 2015-04-28 LAB — HIV ANTIBODY (ROUTINE TESTING W REFLEX): HIV 1&2 Ab, 4th Generation: NONREACTIVE

## 2015-04-29 LAB — CULTURE, OB URINE

## 2015-05-03 ENCOUNTER — Telehealth: Payer: Self-pay | Admitting: *Deleted

## 2015-05-04 DIAGNOSIS — O0000 Abdominal pregnancy without intrauterine pregnancy: Secondary | ICD-10-CM

## 2015-05-04 NOTE — Congregational Nurse Program (Signed)
Client seen by  Nurse today  with  her children ages 3212 months  Twins boys , 22 year old , 22 year old. Informed client of appointment change to Monday 05-10-2015 @ 8 am . Transportation arranged for mother to  be picked up by [employer] , nurse called company and made arrangement,vouchers given to client. Client received her medications on last  Friday 04-30-15 and states she has been checking her blood sugars . Nurse ask her to bring her meter for nurse to review.  Blood sugar taken today 100 mg. Reviewed  with  mother what would  Be a good range  for her  blood sugars  . States she had eaten today .

## 2015-05-05 DIAGNOSIS — O0000 Abdominal pregnancy without intrauterine pregnancy: Secondary | ICD-10-CM

## 2015-05-05 NOTE — Congregational Nurse Program (Signed)
Spoke  with  Client briefly  today  about  Childrens appointments , as they  had not  been under care since mothers move here Tyrell DOB [birth year] [facility] [address] [facility]  [employer] [facility] , other twin Tyrice  to be seen tomorrow. Tyrell was seen for wheezing and mother states was treated today . Both twins are exhibiting high blood sugars and they will monitor and decide what route of treatment to take with  The twins.  This  Mom has numerous issues to cope with, especially if twins are diabetics . Will monitor entire family's  medical issues . [facility] and  hospital wise to be involved with this  family as [facility]  Student  SW  will  be out  on  Break for the next 6 weeks .   Follow  Up  Next  Week after  Mothers prenatal visit as transportation has been arranged.

## 2015-05-10 ENCOUNTER — Encounter: Payer: Medicaid Other | Attending: Obstetrics and Gynecology | Admitting: *Deleted

## 2015-05-10 ENCOUNTER — Ambulatory Visit (INDEPENDENT_AMBULATORY_CARE_PROVIDER_SITE_OTHER): Payer: Medicaid Other | Admitting: Family Medicine

## 2015-05-10 VITALS — BP 105/57 | HR 90 | Temp 97.9°F | Wt 262.0 lb

## 2015-05-10 DIAGNOSIS — Z713 Dietary counseling and surveillance: Secondary | ICD-10-CM | POA: Insufficient documentation

## 2015-05-10 DIAGNOSIS — G932 Benign intracranial hypertension: Secondary | ICD-10-CM

## 2015-05-10 DIAGNOSIS — O24419 Gestational diabetes mellitus in pregnancy, unspecified control: Secondary | ICD-10-CM | POA: Diagnosis not present

## 2015-05-10 DIAGNOSIS — O0990 Supervision of high risk pregnancy, unspecified, unspecified trimester: Secondary | ICD-10-CM

## 2015-05-10 DIAGNOSIS — Z8632 Personal history of gestational diabetes: Principal | ICD-10-CM

## 2015-05-10 DIAGNOSIS — O09293 Supervision of pregnancy with other poor reproductive or obstetric history, third trimester: Secondary | ICD-10-CM | POA: Diagnosis not present

## 2015-05-10 DIAGNOSIS — O3429 Maternal care due to uterine scar from other previous surgery: Secondary | ICD-10-CM

## 2015-05-10 DIAGNOSIS — O0993 Supervision of high risk pregnancy, unspecified, third trimester: Secondary | ICD-10-CM

## 2015-05-10 DIAGNOSIS — O34219 Maternal care for unspecified type scar from previous cesarean delivery: Secondary | ICD-10-CM

## 2015-05-10 LAB — GLUCOSE, CAPILLARY: Glucose-Capillary: 83 mg/dL (ref 65–99)

## 2015-05-10 LAB — POCT URINALYSIS DIP (DEVICE)
BILIRUBIN URINE: NEGATIVE
Glucose, UA: NEGATIVE mg/dL
HGB URINE DIPSTICK: NEGATIVE
KETONES UR: NEGATIVE mg/dL
Nitrite: NEGATIVE
PH: 5.5 (ref 5.0–8.0)
Protein, ur: NEGATIVE mg/dL
SPECIFIC GRAVITY, URINE: 1.025 (ref 1.005–1.030)
Urobilinogen, UA: 0.2 mg/dL (ref 0.0–1.0)

## 2015-05-10 MED ORDER — ACETAZOLAMIDE 250 MG PO TABS: 250.0000 mg | ORAL_TABLET | Freq: Two times a day (BID) | ORAL | Status: AC

## 2015-05-10 MED ORDER — GLYBURIDE 2.5 MG PO TABS: 2.5000 mg | ORAL_TABLET | Freq: Two times a day (BID) | ORAL | Status: AC

## 2015-05-10 NOTE — Patient Instructions (Addendum)
Following an appropriate diet and keeping your blood sugar under control is the most important thing to do for your health and that of your unborn baby.  Please check your blood sugar 4 times daily.  Please keep accurate BS logs and bring them with you to every visit.  Please bring your meter also.  Goals for Blood sugar should be: 1. Fasting (first thing in the morning before eating) should be less than 90.   2.  2 hours after meals should be less than 120.  Please eat 3 meals and 3 snacks.  Include protein (meat, dairy-cheese, eggs, nuts) with all meals.  Be mindful that carbohydrates increase your blood sugar.  Not just sweet food (cookies, cake, donuts, fruit, juice, soda) but also bread, pasta, rice, and potatoes.  You have to limit how many carbs you are eating.  Adding exercise, as little as 30 minutes a day can decrease your blood sugar.  Gestational Diabetes Mellitus Gestational diabetes mellitus, often simply referred to as gestational diabetes, is a type of diabetes that some women develop during pregnancy. In gestational diabetes, the pancreas does not make enough insulin (a hormone), the cells are less responsive to the insulin that is made (insulin resistance), or both.Normally, insulin moves sugars from food into the tissue cells. The tissue cells use the sugars for energy. The lack of insulin or the lack of normal response to insulin causes excess sugars to build up in the blood instead of going into the tissue cells. As a result, high blood sugar (hyperglycemia) develops. The effect of high sugar (glucose) levels can cause many problems.  RISK FACTORS You have an increased chance of developing gestational diabetes if you have a family history of diabetes and also have one or more of the following risk factors:  A body mass index over 30 (obesity).  A previous pregnancy with gestational diabetes.  An older age at the time of pregnancy. If blood glucose levels are kept in  the normal range during pregnancy, women can have a healthy pregnancy. If your blood glucose levels are not well controlled, there may be risks to you, your unborn baby (fetus), your labor and delivery, or your newborn baby.  SYMPTOMS  If symptoms are experienced, they are much like symptoms you would normally expect during pregnancy. The symptoms of gestational diabetes include:   Increased thirst (polydipsia).  Increased urination (polyuria).  Increased urination during the night (nocturia).  Weight loss. This weight loss may be rapid.  Frequent, recurring infections.  Tiredness (fatigue).  Weakness.  Vision changes, such as blurred vision.  Fruity smell to your breath.  Abdominal pain. DIAGNOSIS Diabetes is diagnosed when blood glucose levels are increased. Your blood glucose level may be checked by one or more of the following blood tests:  A fasting blood glucose test. You will not be allowed to eat for at least 8 hours before a blood sample is taken.  A random blood glucose test. Your blood glucose is checked at any time of the day regardless of when you ate.  An oral glucose tolerance test (OGTT). Your blood glucose is measured after you have not eaten (fasted) for 1-3 hours and then after you drink a glucose-containing beverage. Since the hormones that cause insulin resistance are highest at about 24-28 weeks of a pregnancy, an OGTT is usually performed during that time. If you have risk factors, you may be screened for undiagnosed type 2 diabetes at your first prenatal visit. TREATMENT  Gestational diabetes   should be managed first with diet and exercise. Medicines may be added only if they are needed.  You will need to take diabetes medicine or insulin daily to keep blood glucose levels in the desired range.  You will need to match insulin dosing with exercise and healthy food choices. If you have gestational diabetes, your treatment goal is to maintain the following  blood glucose levels:  Before meals (preprandial): at or below 95 mg/dL.  After meals (postprandial):  One hour after a meal: at or below 140 mg/dL.  Two hours after a meal: at or below 120 mg/dL. If you have pre-existing type 1 or type 2 diabetes, your treatment goal is to maintain the following blood glucose levels:  Before meals, at bedtime, and overnight: 60-99 mg/dL.  After meals: peak of 100-129 mg/dL. HOME CARE INSTRUCTIONS   Have your hemoglobin A1c level checked twice a year.  Perform daily blood glucose monitoring as directed by your health care provider. It is common to perform frequent blood glucose monitoring.  Monitor urine ketones when you are ill and as directed by your health care provider.  Take your diabetes medicine and insulin as directed by your health care provider to maintain your blood glucose level in the desired range.  Never run out of diabetes medicine or insulin. It is needed every day.  Adjust insulin based on your intake of carbohydrates. Carbohydrates can raise blood glucose levels but need to be included in your diet. Carbohydrates provide vitamins, minerals, and fiber which are an essential part of a healthy diet. Carbohydrates are found in fruits, vegetables, whole grains, dairy products, legumes, and foods containing added sugars.  Eat healthy foods. Alternate 3 meals with 3 snacks.  Maintain a healthy weight gain. The usual total expected weight gain varies according to your prepregnancy body mass index (BMI).  Carry a medical alert card or wear your medical alert jewelry.  Carry a 15-gram carbohydrate snack with you at all times to treat low blood glucose (hypoglycemia). Some examples of 15-gram carbohydrate snacks include:  Glucose tablets, 3 or 4.  Glucose gel, 15-gram tube.  Raisins, 2 tablespoons (24 g).  Jelly beans, 6.  Animal crackers, 8.  Fruit juice, regular soda, or low-fat milk, 4 ounces (120 mL).  Gummy treats,  9.  Recognize hypoglycemia. Hypoglycemia during pregnancy occurs with blood glucose levels of 60 mg/dL and below. The risk for hypoglycemia increases when fasting or skipping meals, during or after intense exercise, and during sleep. Hypoglycemia symptoms can include:  Tremors or shakes.  Decreased ability to concentrate.  Sweating.  Increased heart rate.  Headache.  Dry mouth.  Hunger.  Irritability.  Anxiety.  Restless sleep.  Altered speech or coordination.  Confusion.  Treat hypoglycemia promptly. If you are alert and able to safely swallow, follow the 15:15 rule:  Take 15-20 grams of rapid-acting glucose or carbohydrate. Rapid-acting options include glucose gel, glucose tablets, or 4 ounces (120 mL) of fruit juice, regular soda, or low-fat milk.  Check your blood glucose level 15 minutes after taking the glucose.  Take 15-20 grams more of glucose if the repeat blood glucose level is still 70 mg/dL or below.  Eat a meal or snack within 1 hour once blood glucose levels return to normal.  Be alert to polyuria (excess urination) and polydipsia (excess thirst) which are early signs of hyperglycemia. An early awareness of hyperglycemia allows for prompt treatment. Treat hyperglycemia as directed by your health care provider.  Engage in at least   30 minutes of physical activity a day or as directed by your health care provider. Ten minutes of physical activity timed 30 minutes after each meal is encouraged to control postprandial blood glucose levels.  Adjust your insulin dosing and food intake as needed if you start a new exercise or sport.  Follow your sick-day plan at any time you are unable to eat or drink as usual.  Avoid tobacco and alcohol use.  Keep all follow-up visits as directed by your health care provider.  Follow the advice of your health care provider regarding your prenatal and post-delivery (postpartum) appointments, meal planning, exercise, medicines,  vitamins, blood tests, other medical tests, and physical activities.  Perform daily skin and foot care. Examine your skin and feet daily for cuts, bruises, redness, nail problems, bleeding, blisters, or sores.  Brush your teeth and gums at least twice a day and floss at least once a day. Follow up with your dentist regularly.  Schedule an eye exam during the first trimester of your pregnancy or as directed by your health care provider.  Share your diabetes management plan with your workplace or school.  Stay up-to-date with immunizations.  Learn to manage stress.  Obtain ongoing diabetes education and support as needed.  Learn about and consider breastfeeding your baby.  You should have your blood sugar level checked 6-12 weeks after delivery. This is done with an oral glucose tolerance test (OGTT). SEEK MEDICAL CARE IF:   You are unable to eat food or drink fluids for more than 6 hours.  You have nausea and vomiting for more than 6 hours.  You have a blood glucose level of 200 mg/dL and you have ketones in your urine.  There is a change in mental status.  You develop vision problems.  You have a persistent headache.  You have upper abdominal pain or discomfort.  You develop an additional serious illness.  You have diarrhea for more than 6 hours.  You have been sick or have had a fever for a couple of days and are not getting better. SEEK IMMEDIATE MEDICAL CARE IF:   You have difficulty breathing.  You no longer feel the baby moving.  You are bleeding or have discharge from your vagina.  You start having premature contractions or labor. MAKE SURE YOU:  Understand these instructions.  Will watch your condition.  Will get help right away if you are not doing well or get worse.   This information is not intended to replace advice given to you by your health care provider. Make sure you discuss any questions you have with your health care provider.   Document  Released: 08/28/2000 Document Revised: 06/12/2014 Document Reviewed: 12/19/2011 [employer] Interactive Patient Education 2016 [employer]  Breastfeeding Deciding to breastfeed is one of the best choices you can make for you and your baby. A change in hormones during pregnancy causes your breast tissue to grow and increases the number and size of your milk ducts. These hormones also allow proteins, sugars, and fats from your blood supply to make breast milk in your milk-producing glands. Hormones prevent breast milk from being released before your baby is born as well as prompt milk flow after birth. Once breastfeeding has begun, thoughts of your baby, as well as his or her sucking or crying, can stimulate the release of milk from your milk-producing glands.  BENEFITS OF BREASTFEEDING For Your Baby  Your first milk (colostrum) helps your baby's digestive system function better.  There are   antibodies in your milk that help your baby fight off infections.  Your baby has a lower incidence of asthma, allergies, and sudden infant death syndrome.  The nutrients in breast milk are better for your baby than infant formulas and are designed uniquely for your baby's needs.  Breast milk improves your baby's brain development.  Your baby is less likely to develop other conditions, such as childhood obesity, asthma, or type 2 diabetes mellitus. For You  Breastfeeding helps to create a very special bond between you and your baby.  Breastfeeding is convenient. Breast milk is always available at the correct temperature and costs nothing.  Breastfeeding helps to burn calories and helps you lose the weight gained during pregnancy.  Breastfeeding makes your uterus contract to its prepregnancy size faster and slows bleeding (lochia) after you give birth.   Breastfeeding helps to lower your risk of developing type 2 diabetes mellitus, osteoporosis, and breast or ovarian cancer later in life. SIGNS THAT  YOUR BABY IS HUNGRY Early Signs of Hunger  Increased alertness or activity.  Stretching.  Movement of the head from side to side.  Movement of the head and opening of the mouth when the corner of the mouth or cheek is stroked (rooting).  Increased sucking sounds, smacking lips, cooing, sighing, or squeaking.  Hand-to-mouth movements.  Increased sucking of fingers or hands. Late Signs of Hunger  Fussing.  Intermittent crying. Extreme Signs of Hunger Signs of extreme hunger will require calming and consoling before your baby will be able to breastfeed successfully. Do not wait for the following signs of extreme hunger to occur before you initiate breastfeeding:  Restlessness.  A loud, strong cry.  Screaming. BREASTFEEDING BASICS Breastfeeding Initiation  Find a comfortable place to sit or lie down, with your neck and back well supported.  Place a pillow or rolled up blanket under your baby to bring him or her to the level of your breast (if you are seated). Nursing pillows are specially designed to help support your arms and your baby while you breastfeed.  Make sure that your baby's abdomen is facing your abdomen.  Gently massage your breast. With your fingertips, massage from your chest wall toward your nipple in a circular motion. This encourages milk flow. You may need to continue this action during the feeding if your milk flows slowly.  Support your breast with 4 fingers underneath and your thumb above your nipple. Make sure your fingers are well away from your nipple and your baby's mouth.  Stroke your baby's lips gently with your finger or nipple.  When your baby's mouth is open wide enough, quickly bring your baby to your breast, placing your entire nipple and as much of the colored area around your nipple (areola) as possible into your baby's mouth.  More areola should be visible above your baby's upper lip than below the lower lip.  Your baby's tongue should  be between his or her lower gum and your breast.  Ensure that your baby's mouth is correctly positioned around your nipple (latched). Your baby's lips should create a seal on your breast and be turned out (everted).  It is common for your baby to suck about 2-3 minutes in order to start the flow of breast milk. Latching Teaching your baby how to latch on to your breast properly is very important. An improper latch can cause nipple pain and decreased milk supply for you and poor weight gain in your baby. Also, if your baby is not   latched onto your nipple properly, he or she may swallow some air during feeding. This can make your baby fussy. Burping your baby when you switch breasts during the feeding can help to get rid of the air. However, teaching your baby to latch on properly is still the best way to prevent fussiness from swallowing air while breastfeeding. Signs that your baby has successfully latched on to your nipple:  Silent tugging or silent sucking, without causing you pain.  Swallowing heard between every 3-4 sucks.  Muscle movement above and in front of his or her ears while sucking. Signs that your baby has not successfully latched on to nipple:  Sucking sounds or smacking sounds from your baby while breastfeeding.  Nipple pain. If you think your baby has not latched on correctly, slip your finger into the corner of your baby's mouth to break the suction and place it between your baby's gums. Attempt breastfeeding initiation again. Signs of Successful Breastfeeding Signs from your baby:  A gradual decrease in the number of sucks or complete cessation of sucking.  Falling asleep.  Relaxation of his or her body.  Retention of a small amount of milk in his or her mouth.  Letting go of your breast by himself or herself. Signs from you:  Breasts that have increased in firmness, weight, and size 1-3 hours after feeding.  Breasts that are softer immediately after  breastfeeding.  Increased milk volume, as well as a change in milk consistency and color by the fifth day of breastfeeding.  Nipples that are not sore, cracked, or bleeding. Signs That Your Baby is Getting Enough Milk  Wetting at least 3 diapers in a 24-hour period. The urine should be clear and pale yellow by age 5 days.  At least 3 stools in a 24-hour period by age 5 days. The stool should be soft and yellow.  At least 3 stools in a 24-hour period by age 7 days. The stool should be seedy and yellow.  No loss of weight greater than 10% of birth weight during the first 3 days of age.  Average weight gain of 4-7 ounces (113-198 g) per week after age 4 days.  Consistent daily weight gain by age 5 days, without weight loss after the age of 2 weeks. After a feeding, your baby may spit up a small amount. This is common. BREASTFEEDING FREQUENCY AND DURATION Frequent feeding will help you make more milk and can prevent sore nipples and breast engorgement. Breastfeed when you feel the need to reduce the fullness of your breasts or when your baby shows signs of hunger. This is called "breastfeeding on demand." Avoid introducing a pacifier to your baby while you are working to establish breastfeeding (the first 4-6 weeks after your baby is born). After this time you may choose to use a pacifier. Research has shown that pacifier use during the first year of a baby's life decreases the risk of sudden infant death syndrome (SIDS). Allow your baby to feed on each breast as long as he or she wants. Breastfeed until your baby is finished feeding. When your baby unlatches or falls asleep while feeding from the first breast, offer the second breast. Because newborns are often sleepy in the first few weeks of life, you may need to awaken your baby to get him or her to feed. Breastfeeding times will vary from baby to baby. However, the following rules can serve as a guide to help you ensure that your baby is    properly fed:  Newborns (babies 4 weeks of age or younger) may breastfeed every 1-3 hours.  Newborns should not go longer than 3 hours during the day or 5 hours during the night without breastfeeding.  You should breastfeed your baby a minimum of 8 times in a 24-hour period until you begin to introduce solid foods to your baby at around 6 months of age. BREAST MILK PUMPING Pumping and storing breast milk allows you to ensure that your baby is exclusively fed your breast milk, even at times when you are unable to breastfeed. This is especially important if you are going back to work while you are still breastfeeding or when you are not able to be present during feedings. Your lactation consultant can give you guidelines on how long it is safe to store breast milk. A breast pump is a machine that allows you to pump milk from your breast into a sterile bottle. The pumped breast milk can then be stored in a refrigerator or freezer. Some breast pumps are operated by hand, while others use electricity. Ask your lactation consultant which type will work best for you. Breast pumps can be purchased, but some hospitals and breastfeeding support groups lease breast pumps on a monthly basis. A lactation consultant can teach you how to hand express breast milk, if you prefer not to use a pump. CARING FOR YOUR BREASTS WHILE YOU BREASTFEED Nipples can become dry, cracked, and sore while breastfeeding. The following recommendations can help keep your breasts moisturized and healthy:  Avoid using soap on your nipples.  Wear a supportive bra. Although not required, special nursing bras and tank tops are designed to allow access to your breasts for breastfeeding without taking off your entire bra or top. Avoid wearing underwire-style bras or extremely tight bras.  Air dry your nipples for 3-4minutes after each feeding.  Use only cotton bra pads to absorb leaked breast milk. Leaking of breast milk between feedings  is normal.  Use lanolin on your nipples after breastfeeding. Lanolin helps to maintain your skin's normal moisture barrier. If you use pure lanolin, you do not need to wash it off before feeding your baby again. Pure lanolin is not toxic to your baby. You may also hand express a few drops of breast milk and gently massage that milk into your nipples and allow the milk to air dry. In the first few weeks after giving birth, some women experience extremely full breasts (engorgement). Engorgement can make your breasts feel heavy, warm, and tender to the touch. Engorgement peaks within 3-5 days after you give birth. The following recommendations can help ease engorgement:  Completely empty your breasts while breastfeeding or pumping. You may want to start by applying warm, moist heat (in the shower or with warm water-soaked hand towels) just before feeding or pumping. This increases circulation and helps the milk flow. If your baby does not completely empty your breasts while breastfeeding, pump any extra milk after he or she is finished.  Wear a snug bra (nursing or regular) or tank top for 1-2 days to signal your body to slightly decrease milk production.  Apply ice packs to your breasts, unless this is too uncomfortable for you.  Make sure that your baby is latched on and positioned properly while breastfeeding. If engorgement persists after 48 hours of following these recommendations, contact your health care provider or a lactation consultant. OVERALL HEALTH CARE RECOMMENDATIONS WHILE BREASTFEEDING  Eat healthy foods. Alternate between meals and snacks, eating 3   of each per day. Because what you eat affects your breast milk, some of the foods may make your baby more irritable than usual. Avoid eating these foods if you are sure that they are negatively affecting your baby.  Drink milk, fruit juice, and water to satisfy your thirst (about 10 glasses a day).  Rest often, relax, and continue to take  your prenatal vitamins to prevent fatigue, stress, and anemia.  Continue breast self-awareness checks.  Avoid chewing and smoking tobacco. Chemicals from cigarettes that pass into breast milk and exposure to secondhand smoke may harm your baby.  Avoid alcohol and drug use, including marijuana. Some medicines that may be harmful to your baby can pass through breast milk. It is important to ask your health care provider before taking any medicine, including all over-the-counter and prescription medicine as well as vitamin and herbal supplements. It is possible to become pregnant while breastfeeding. If birth control is desired, ask your health care provider about options that will be safe for your baby. SEEK MEDICAL CARE IF:  You feel like you want to stop breastfeeding or have become frustrated with breastfeeding.  You have painful breasts or nipples.  Your nipples are cracked or bleeding.  Your breasts are red, tender, or warm.  You have a swollen area on either breast.  You have a fever or chills.  You have nausea or vomiting.  You have drainage other than breast milk from your nipples.  Your breasts do not become full before feedings by the fifth day after you give birth.  You feel sad and depressed.  Your baby is too sleepy to eat well.  Your baby is having trouble sleeping.   Your baby is wetting less than 3 diapers in a 24-hour period.  Your baby has less than 3 stools in a 24-hour period.  Your baby's skin or the white part of his or her eyes becomes yellow.   Your baby is not gaining weight by 5 days of age. SEEK IMMEDIATE MEDICAL CARE IF:  Your baby is overly tired (lethargic) and does not want to wake up and feed.  Your baby develops an unexplained fever.   This information is not intended to replace advice given to you by your health care provider. Make sure you discuss any questions you have with your health care provider.   Document Released: 05/22/2005  Document Revised: 02/10/2015 Document Reviewed: 11/13/2012 [employer] Interactive Patient Education 2016 [employer]  

## 2015-05-10 NOTE — Progress Notes (Signed)
Nutrition note: GDM diet education Pt has h/o obesity & has GDM (had it with her previous pregnancy with twins as well). Per pt's report of pregravid wt, pt has lost 33# @ 5116w5d but am unsure if this is accurate. Pt first reported that she only eats 1x/d ~10pm but later pt reported that she eats fruit ~5x/d and vegetables 2x/d so pt is eating more than first reported. Pt is taking a PNV. Pt reports [facility] with her 4 children. Pt reports no N&V or heartburn. Pt is allergic to shellfish. Pt received verbal & written education on GDM diet. Encouraged pt to try to eat 3 meals/d and to watch when she eats her fruit (timing & when she is checking BS). Discussed importance/ benefits of BF. Discussed wt gain goals of 11-20# or 0.5#/wk. Pt agrees to follow GDM diet with 3 meals & 1-3 snacks/d with proper CHO/ protein combination. Pt does not have WIC but plans to apply. Pt does not plan to BF. F/u in 2-4 wks Blondell RevealLaura Catha Ontko, MS, RD, LDN, [facility]

## 2015-05-10 NOTE — Progress Notes (Signed)
  Patient was seen on 05/10/15 for Gestational Diabetes self-management . Sharon Santana and her four children under the age of 4 have recently moved here from [place]., are homeless living at the [employer]. Kynslie does have an Aunt that lives in the area. She is being assisted by [facility] She did not bring her glucometer nor record book with her. She also can not say exactly when she is testing.She notes her glucose readings range from 70-$RemoveBefor'248mg'XGqvyfhRuIfy$ /dl.  I have provided her with the Pink logging sheet which makes it more specific as to time to test. I have also encouraged her to bring her glucometer and record sheet with her to every appointment. She will begin Glyburide 2.$RemoveBeforeDEI'5mg'ilcRIlWSSzDgWNgK$  BID today.  The following learning objectives were met by the patient :   States the definition of Gestational Diabetes  States when to check blood glucose levels  Demonstrates proper blood glucose monitoring techniques  States the effect of stress and exercise on blood glucose levels  Plan:  Consider  increasing your activity level by walking daily as tolerated Begin checking BG before breakfast and 2 hours after first bit of breakfast, lunch and dinner after  as directed by MD  Take medication  as directed by MD  Blood glucose monitor given: Presently Has Accu-Check Nano Blood glucose reading: $RemoveBeforeDE'83mg'IKmOxgJnQnAUdGS$ /dl following a [employer] bar  Patient instructed to monitor glucose levels: FBS: 60 - <90 2 hour: <120  Patient received the following handouts:  Nutrition Diabetes and Pregnancy  Carbohydrate Counting List  Meal Planning worksheet  Patient will be seen in 1 week

## 2015-05-10 NOTE — Progress Notes (Signed)
Subjective:  Sharon Santana is a 24 y.o. G4P3004 at 6941w5d being seen today for ongoing prenatal care.  She is currently monitored for the following issues for this high-risk pregnancy and has Supervision of high risk pregnancy, antepartum; History of gestational diabetes in prior pregnancy, currently pregnant; Homeless family; GERD (gastroesophageal reflux disease); Gestational diabetes mellitus (GDM), antepartum; Hx of preeclampsia, prior pregnancy, currently pregnant; History of twin pregnancy in prior pregnancy; Pseudotumor cerebri; and Uterine scar from previous cesarean delivery affecting pregnancy on her problem list.  Patient reports headache.  Contractions: Irritability. Vag. Bleeding: None.  Movement: Present. Denies leaking of fluid.   The following portions of the patient's history were reviewed and updated as appropriate: allergies, current medications, past family history, past medical history, past social history, past surgical history and problem list. Problem list updated.  Objective:   Filed Vitals:   05/10/15 0757  BP: 105/57  Pulse: 90  Temp: 97.9 F (36.6 C)  Weight: 262 lb (118.842 kg)    Fetal Status: Fetal Heart Rate (bpm): 130 Fundal Height: 37 cm Movement: Present     General:  Alert, oriented and cooperative. Patient is in no acute distress.  Skin: Skin is warm and dry. No rash noted.   Cardiovascular: Normal heart rate noted  Respiratory: Normal respiratory effort, no problems with respiration noted  Abdomen: Soft, gravid, appropriate for gestational age. Pain/Pressure: Present     Pelvic: Vag. Bleeding: None     Cervical exam deferred        Extremities: Normal range of motion.  Edema: Trace  Mental Status: Normal mood and affect. Normal behavior. Normal judgment and thought content.   Urinalysis:    Prot neg, gluc neg No book today--reports BS are high and on no meds-  Remembers one BS 248. Assessment and Plan:  Pregnancy: [id number] at 7641w5d   1.  Gestational diabetes mellitus (GDM), antepartum, gestational diabetes method of control unspecified Begin meds for high BS--may need to increase dose--see back in 1 wk for adjustment Will see diabetes education today for nutrition/diet and med teaching. U/S for growth in 2 days - glyBURIDE (DIABETA) 2.5 MG tablet; Take 1 tablet (2.5 mg total) by mouth 2 (two) times daily with a meal.  Dispense: 60 tablet; Refill: 3  2. Uterine scar from previous cesarean delivery affecting pregnancy TOLAC consent signed Biggest vaginal birth 7 lb 4 oz  3. Supervision of high risk pregnancy, antepartum, third trimester Continue current PNC  4. Pseudotumor cerebri Change her meds and increase dosage - acetaZOLAMIDE (DIAMOX) 250 MG tablet; Take 1 tablet (250 mg total) by mouth 2 (two) times daily.  Dispense: 60 tablet; Refill: 3   Preterm labor symptoms and general obstetric precautions including but not limited to vaginal bleeding, contractions, leaking of fluid and fetal movement were reviewed in detail with the patient. Please refer to After Visit Summary for other counseling recommendations.  [facility] in [facility] week [facility]on 05/17/2015[facility] only in 10 days.   Reva Boresanya S Kody Brandl, MD

## 2015-05-10 NOTE — Progress Notes (Signed)
Reviewed tip of week with patient  

## 2015-05-11 ENCOUNTER — Encounter: Payer: Medicaid Other | Admitting: Advanced Practice Midwife

## 2015-05-11 DIAGNOSIS — O0000 Abdominal pregnancy without intrauterine pregnancy: Secondary | ICD-10-CM

## 2015-05-12 ENCOUNTER — Ambulatory Visit (HOSPITAL_COMMUNITY)
Admission: RE | Admit: 2015-05-12 | Discharge: 2015-05-12 | Disposition: A | Discharge status: Home / Self Care | Payer: Medicaid Other | Source: Ambulatory Visit | Attending: Family Medicine | Admitting: Family Medicine

## 2015-05-12 VITALS — BP 107/54 | HR 72 | Wt 264.8 lb

## 2015-05-12 DIAGNOSIS — Z3A31 31 weeks gestation of pregnancy: Secondary | ICD-10-CM | POA: Diagnosis not present

## 2015-05-12 DIAGNOSIS — Z36 Encounter for antenatal screening of mother: Secondary | ICD-10-CM | POA: Insufficient documentation

## 2015-05-12 DIAGNOSIS — Z0489 Encounter for examination and observation for other specified reasons: Secondary | ICD-10-CM

## 2015-05-12 DIAGNOSIS — O0990 Supervision of high risk pregnancy, unspecified, unspecified trimester: Secondary | ICD-10-CM

## 2015-05-12 DIAGNOSIS — O34219 Maternal care for unspecified type scar from previous cesarean delivery: Secondary | ICD-10-CM | POA: Insufficient documentation

## 2015-05-12 DIAGNOSIS — O24419 Gestational diabetes mellitus in pregnancy, unspecified control: Secondary | ICD-10-CM | POA: Diagnosis not present

## 2015-05-12 DIAGNOSIS — O09293 Supervision of pregnancy with other poor reproductive or obstetric history, third trimester: Secondary | ICD-10-CM | POA: Diagnosis not present

## 2015-05-12 DIAGNOSIS — IMO0002 Reserved for concepts with insufficient information to code with codable children: Secondary | ICD-10-CM

## 2015-05-12 DIAGNOSIS — O99213 Obesity complicating pregnancy, third trimester: Secondary | ICD-10-CM | POA: Insufficient documentation

## 2015-05-12 DIAGNOSIS — E669 Obesity, unspecified: Secondary | ICD-10-CM | POA: Insufficient documentation

## 2015-05-12 NOTE — Congregational Nurse Program (Signed)
Sharon ReevesShakijah  has an appointment  tomorrow for an Ultra sound and notified nurse late pm and transportation was an issue. Nurse will [facility]  [facility].Appointment is at 1 pm.Nurse will complete [employer] application for transportation to medical  Appointments. Client states  She was seen on 05-10-15 by  High risk team per client.

## 2015-05-17 ENCOUNTER — Other Ambulatory Visit: Payer: Medicaid Other

## 2015-05-17 ENCOUNTER — Encounter: Payer: Medicaid Other | Admitting: Obstetrics and Gynecology

## 2015-05-18 DIAGNOSIS — O0000 Abdominal pregnancy without intrauterine pregnancy: Secondary | ICD-10-CM

## 2015-05-18 NOTE — Congregational Nurse Program (Signed)
Nurse  ask  client to  stop by  today and  questioned  her as to  why  she  failed to   keep her  MD appointment on Monday as transportation had   been arranged. she states I didn't  have   a Arts administratorbaby sitter so  She  couldn't  Keep appointment. States  She  Called to reschedule  But  couldn t remember the date  Go . Taxi driver  Came to site  And was waiting on her . Client states she  Called to cancel ride on Sunday. Nurse had arranged  Transportation and  [facility]  Over [facility] week end . DSS had been called  3 times  And left messages to  Call to arrange  Medicaid  Transportation  For  Client with  No success . Case manager and  Supervisor  Have  Not  Returned  Calls  Client reports she is eating  Better and  Checking  Her blood  Sugars  . Instructed to  Take her glucometer to  Her  MD  Appointments . Blood  Sugar  Taken ,states  She eat  BK,no  Lunch  And will eat supper..   Blood  Sugar 90 mg @ 4:30 pm  Counseled

## 2015-05-20 ENCOUNTER — Encounter: Payer: Medicaid Other | Admitting: Obstetrics & Gynecology

## 2015-05-20 ENCOUNTER — Encounter: Payer: Self-pay | Admitting: Obstetrics & Gynecology

## 2015-05-24 NOTE — Congregational Nurse Program (Signed)
Client  [facility] end 05-22-15 , Per  Case management  She  Discharged  self  stating  [name] was going  back to  [place] to stay.

## 2015-06-09 ENCOUNTER — Ambulatory Visit (HOSPITAL_COMMUNITY): Admission: RE | Admit: 2015-06-09 | Payer: Medicaid Other | Source: Ambulatory Visit

## 2015-12-14 ENCOUNTER — Encounter: Payer: Self-pay | Admitting: *Deleted

## 2016-02-19 ENCOUNTER — Encounter (HOSPITAL_COMMUNITY): Payer: Self-pay

## 2017-07-03 ENCOUNTER — Encounter: Payer: Self-pay | Admitting: *Deleted

## 2017-10-08 IMAGING — US US MFM OB DETAIL+14 WK
1 series · 13 of 28 positions shown · non-contrast
Comparison: none

[Series 1: us mfm ob detail+14 wk · 93 acquisitions, 13 frames shown]
[im 4/93]
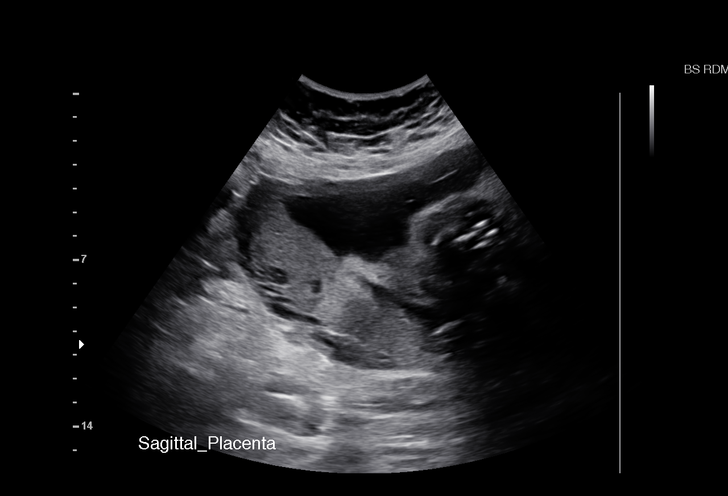
[im 11/93]
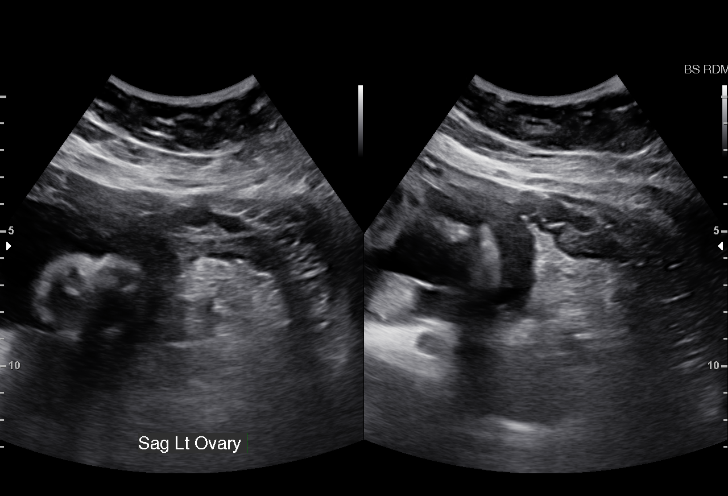
[im 18/93]
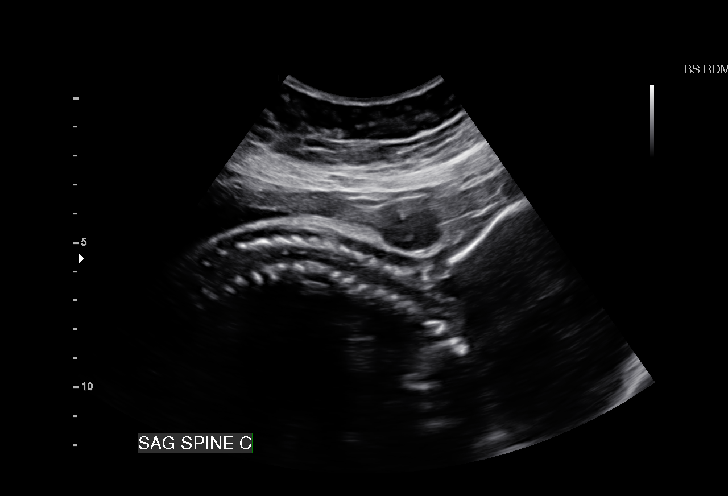
[im 24/93]
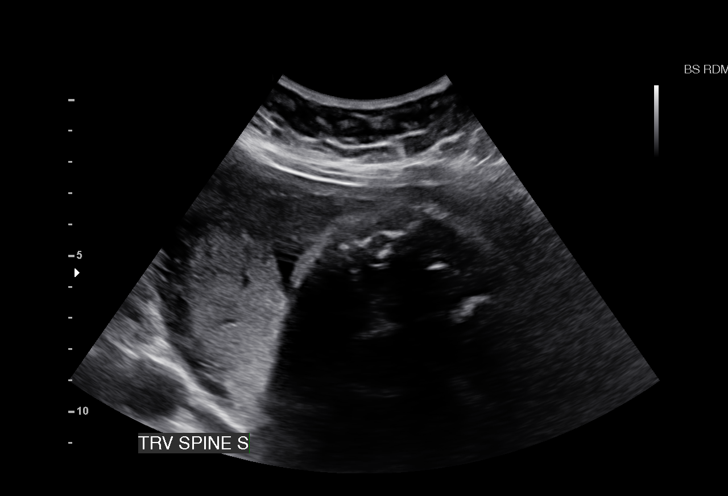
[im 31/93]
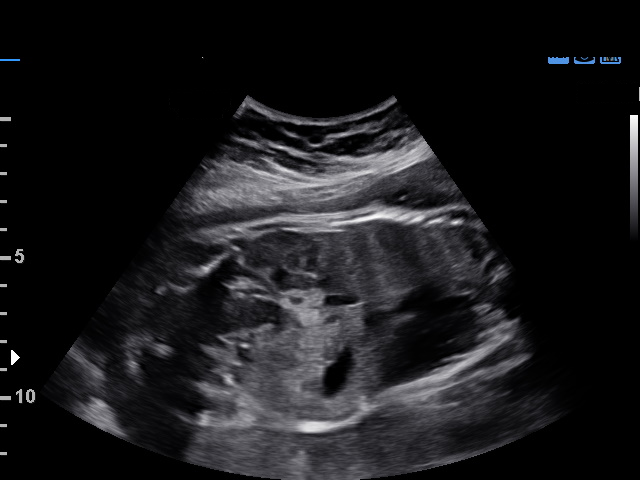
[im 38/93]
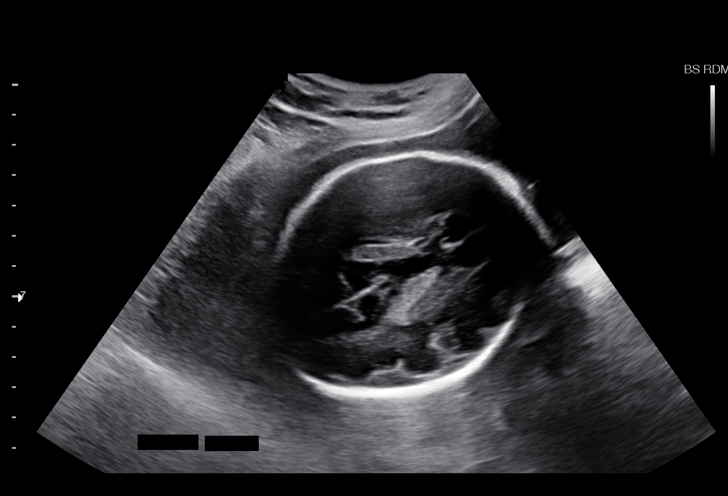
[im 48/93]
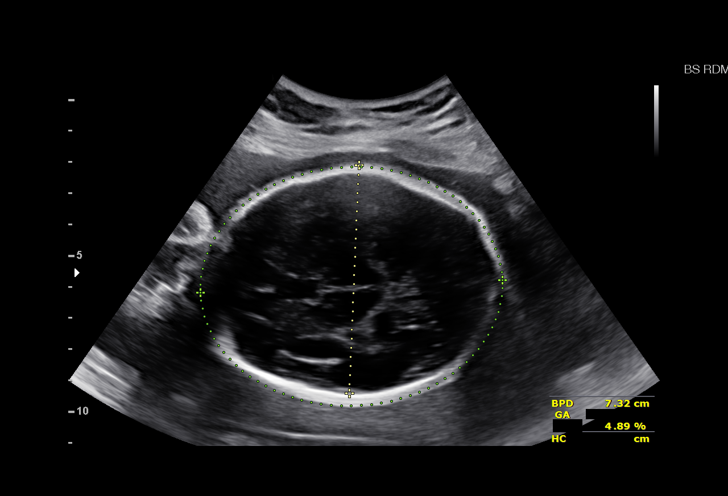
[im 55/93]
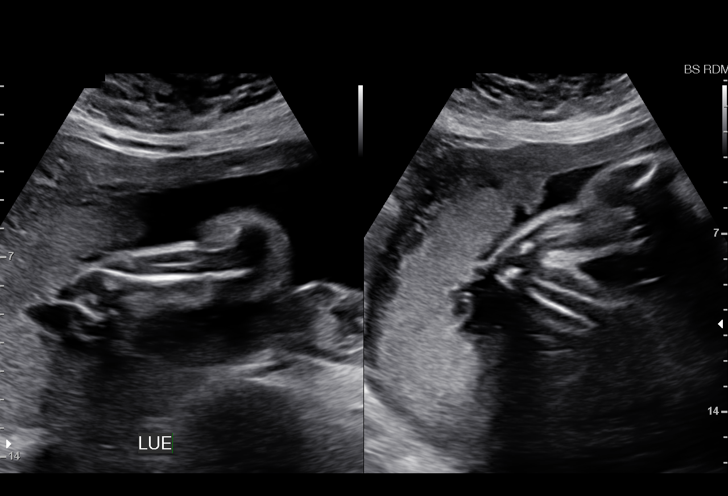
[im 62/93]
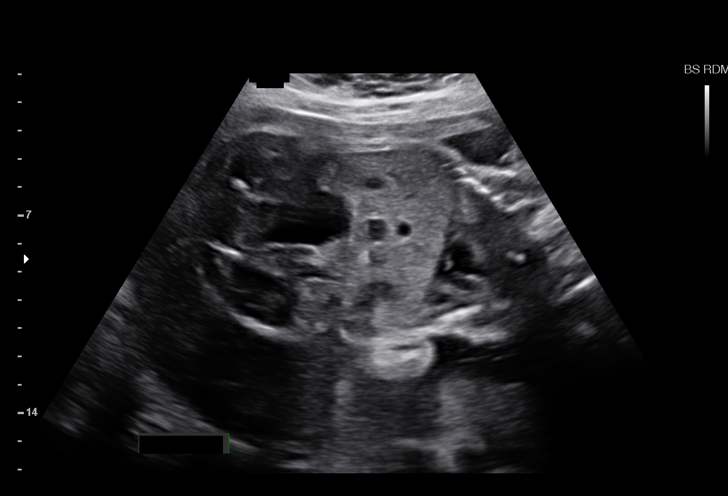
[im 69/93]
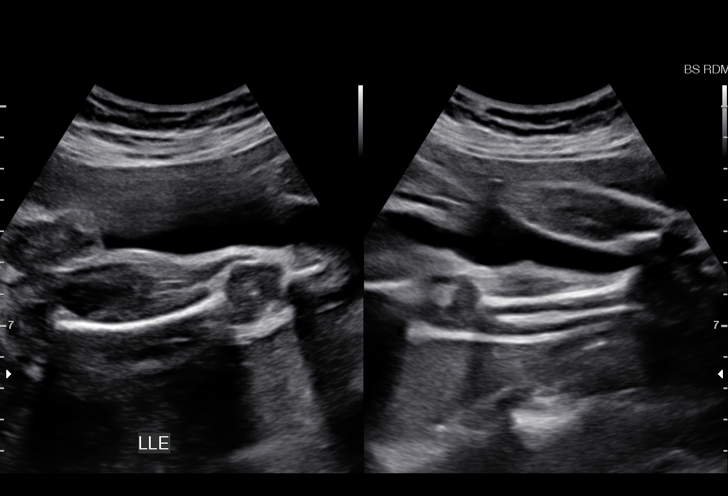
[im 75/93]
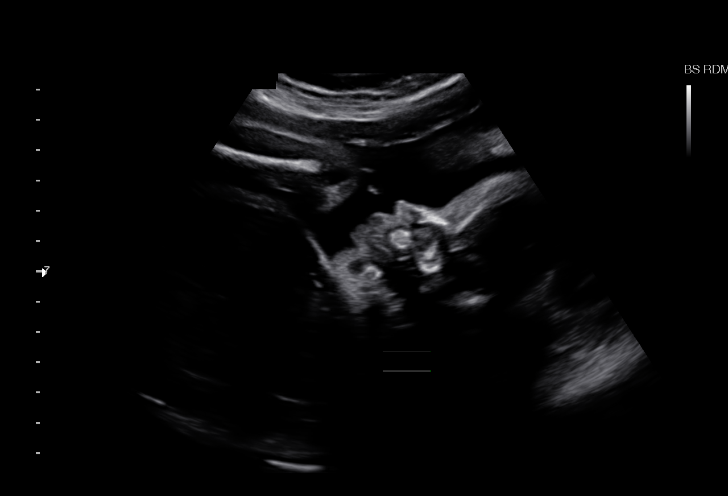
[im 82/93]
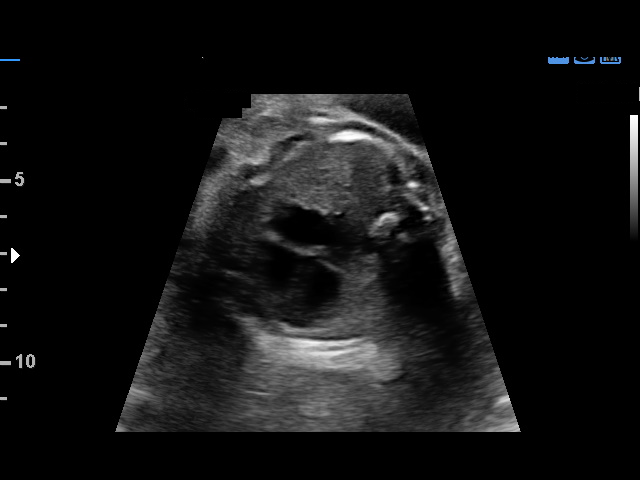
[im 89/93]
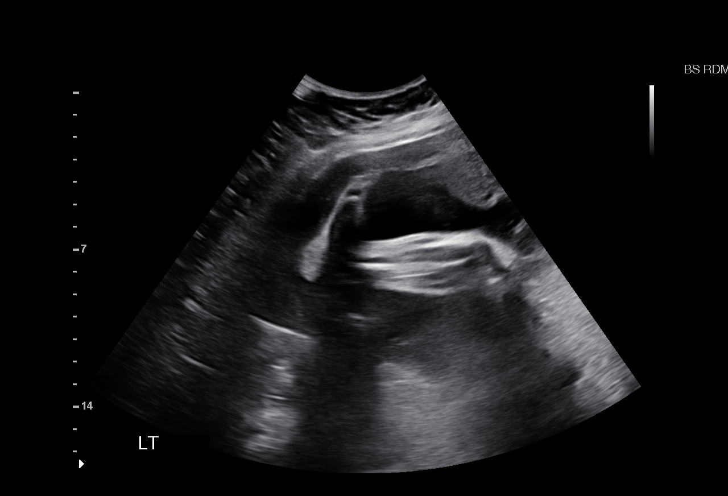

[13 of 28 positions shown; findings below may reference images not displayed]

pm)

Name:       PETAR NOAH ABDULLAH PADJEN                      Visit  05/12/2015 [DATE]
Date:

Clinic
[REDACTED]-
Faculty Physician

1  TRIVENA DIDI          [id number]       [id number]     [id number]
Indications

Detailed fetal anatomic survey                  Z36
Gestational diabetes in pregnancy,
unspecified control
Obesity complicating pregnancy
Previous cesarean delivery, antepartum
Poor obstetric history: Previous gestational
diabetes
Poor obstetric history: Previous
preeclampsia / eclampsia/gestational HTN
Pseudotumor cerebri
31 weeks gestation of pregnancy
OB History

Height:        5'4"   Weight:   264.8      BMI:
Gravidity:     4         Term:  3        Prem:    0        SAB:   0
TOP:           0       Ectopic  0        Living:  4
:
Fetal Evaluation
Num Of Fetuses:      1
Fetal Heart          132
Rate(bpm):
Cardiac Activity:    Observed
Presentation:        Cephalic
Placenta:            Posterior, above cervical os
P. Cord Insertion:   Visualized, central

Amniotic Fluid
AFI FV:      Subjectively within normal limits
AFI Sum:     17.05    cm      63  %Tile     Larg Pckt:    4.66   cm
RUQ:   4.66    cm    RLQ:   3.63    cm   LUQ:    4.56    cm   LLQ:    4.2    cm
Biometry

BPD:      73.1  mm     G. Age:   29w 2d                  CI:        70.48   %    70 - 86
FL/HC:      20.4   %    19.3 -
HC:      277.6  mm     G. Age:   30w 3d          7  %    HC/AC:      1.09        0.96 -
AC:      255.8  mm     G. Age:   29w 5d        15   %    FL/BPD      77.4   %    71 - 87
:
FL:       56.6  mm     G. Age:   29w 5d          9  %    FL/AC:      22.1   %    20 - 24
HUM:      50.2  mm     G. Age:   29w 3d        19   %
LV:        4.3  mm

Est.        9316   gm    3 lb 3 oz      30   %
FW:
Gestational Age

U/S Today:     29w 6d                                         EDD:   07/22/15
Best:          31w 0d    Det. By:   Previous Ultrasound       EDD:   07/14/15
Anatomy

Cranium:          Appears normal         Aortic Arch:       Appears normal
Fetal Cavum:      Appears normal         Ductal Arch:       Not well visualized
Ventricles:       Appears normal         Diaphragm:         Appears normal
Choroid Plexus:   Appears normal         Stomach:           Appears normal,
left sided
Cerebellum:       Appears normal         Abdomen:           Appears normal
Posterior         Appears normal         Abdominal          Appears nml (cord
Fossa:                                   Wall:              insert, abd wall)
Nuchal Fold:      Not applicable (>20    Cord Vessels:      Appears normal (3
wks GA)                                   vessel cord)
Face:             Orbits appear          Kidneys:           Appear normal
normal
Lips:             Appears normal         Bladder:           Appears normal
Palate:           Not well visualized    Spine:             Limited views
appear normal
Heart:            Not well visualized    Upper              Present
Extremities:
RVOT:             Not well visualized    Lower              Present
Extremities:
LVOT:             Not well visualized

Other:   Fetus appears to be a female. Technically difficult due to advanced
GA and fetal position.
Cervix Uterus Adnexa

Cervix
Length:             3.6  cm.
Normal appearance by transabdominal scan.

Left Ovary
Within normal limits.

Right Ovary
Within normal limits.
Impression

Single IUP at 29w 6d
A2 Gestational diabetes, Obesity
Limited views of the fetal heart obtained due to fetal position
Fetal growth is appropriate (30th %tile)
Posterior placenta without previa
Normal amniotic fluid volume
Recommendations

Recommend follow-up ultrasound examination in 4 weeks
for interval growth / reevaluate fetal heart
Serial ultrasounds for growth every 4 weeks due to A2 GDM
Antenatal testing beginning at 32 weeks

## 2018-09-10 ENCOUNTER — Encounter: Payer: Self-pay | Admitting: *Deleted

## 2018-10-07 ENCOUNTER — Emergency Department: Admit: 2018-10-07 | Payer: MEDICAID | Primary: Family

## 2018-10-07 ENCOUNTER — Inpatient Hospital Stay
Admit: 2018-10-07 | Discharge: 2018-10-07 | Disposition: A | Discharge status: Home / Self Care | Payer: MEDICAID | Attending: Emergency Medicine

## 2018-10-07 DIAGNOSIS — O210 Mild hyperemesis gravidarum: Secondary | ICD-10-CM

## 2018-10-07 LAB — METABOLIC PANEL, COMPREHENSIVE
A-G Ratio: 1 — ABNORMAL LOW (ref 1.1–2.2)
ALT (SGPT): 40 U/L (ref 12–78)
AST (SGOT): 35 U/L (ref 15–37)
Albumin: 4 g/dL (ref 3.5–5.0)
Alk. phosphatase: 62 U/L (ref 45–117)
Anion gap: 7 mmol/L (ref 5–15)
BUN/Creatinine ratio: 9 — ABNORMAL LOW (ref 12–20)
BUN: 6 MG/DL (ref 6–20)
Bilirubin, total: 1.2 MG/DL — ABNORMAL HIGH (ref 0.2–1.0)
CO2: 22 mmol/L (ref 21–32)
Calcium: 9.4 MG/DL (ref 8.5–10.1)
Chloride: 105 mmol/L (ref 97–108)
Creatinine: 0.65 MG/DL (ref 0.55–1.02)
GFR est AA: >60 mL/min/{1.73_m2} (ref >60)
GFR est non-AA: >60 mL/min/{1.73_m2} (ref >60)
Globulin: 4.2 g/dL — ABNORMAL HIGH (ref 2.0–4.0)
Glucose: 78 mg/dL (ref 65–100)
Potassium: 4.7 mmol/L (ref 3.5–5.1)
Protein, total: 8.2 g/dL (ref 6.4–8.2)
Sodium: 134 mmol/L — ABNORMAL LOW (ref 136–145)

## 2018-10-07 LAB — CBC WITH AUTOMATED DIFF
ABS. BASOPHILS: 0 10*3/uL (ref 0.0–0.1)
ABS. EOSINOPHILS: 0.1 10*3/uL (ref 0.0–0.4)
ABS. IMM. GRANS.: 0 10*3/uL (ref 0.00–0.04)
ABS. LYMPHOCYTES: 2.4 10*3/uL (ref 0.8–3.5)
ABS. MONOCYTES: 0.4 10*3/uL (ref 0.0–1.0)
ABS. NEUTROPHILS: 2.8 10*3/uL (ref 1.8–8.0)
ABSOLUTE NRBC: 0 10*3/uL (ref 0.00–0.01)
BASOPHILS: 0 % (ref 0–1)
EOSINOPHILS: 1 % (ref 0–7)
HCT: 43.3 % (ref 35.0–47.0)
HGB: 14.4 g/dL (ref 11.5–16.0)
IMMATURE GRANULOCYTES: 0 % (ref 0.0–0.5)
LYMPHOCYTES: 42 % (ref 12–49)
MCH: 30 PG (ref 26.0–34.0)
MCHC: 33.3 g/dL (ref 30.0–36.5)
MCV: 90.2 FL (ref 80.0–99.0)
MONOCYTES: 7 % (ref 5–13)
NEUTROPHILS: 50 % (ref 32–75)
NRBC: 0 PER 100 WBC
PLATELET: 133 10*3/uL — ABNORMAL LOW (ref 150–400)
RBC: 4.8 M/uL (ref 3.80–5.20)
RDW: 12.8 % (ref 11.5–14.5)
WBC: 5.7 10*3/uL (ref 3.6–11.0)

## 2018-10-07 LAB — BETA HCG, QT
Beta HCG, QT: 15215 m[IU]/mL — ABNORMAL HIGH (ref 0–6)
hCG Quant: 15215 m[IU]/mL — ABNORMAL HIGH (ref 0–6)

## 2018-10-07 LAB — COMPREHENSIVE METABOLIC PANEL
ALT: 40 U/L (ref 12–78)
AST: 35 U/L (ref 15–37)
Albumin/Globulin Ratio: 1 — ABNORMAL LOW (ref 1.1–2.2)
Albumin: 4 g/dL (ref 3.5–5.0)
Alkaline Phosphatase: 62 U/L (ref 45–117)
Anion Gap: 7 mmol/L (ref 5–15)
BUN: 6 MG/DL (ref 6–20)
Bun/Cre Ratio: 9 — ABNORMAL LOW (ref 12–20)
CO2: 22 mmol/L (ref 21–32)
Calcium: 9.4 MG/DL (ref 8.5–10.1)
Chloride: 105 mmol/L (ref 97–108)
Creatinine: 0.65 MG/DL (ref 0.55–1.02)
EGFR IF NonAfrican American: >60 mL/min/{1.73_m2} (ref >60)
GFR African American: >60 mL/min/{1.73_m2} (ref >60)
Globulin: 4.2 g/dL — ABNORMAL HIGH (ref 2.0–4.0)
Glucose: 78 mg/dL (ref 65–100)
Potassium: 4.7 mmol/L (ref 3.5–5.1)
Sodium: 134 mmol/L — ABNORMAL LOW (ref 136–145)
Total Bilirubin: 1.2 MG/DL — ABNORMAL HIGH (ref 0.2–1.0)
Total Protein: 8.2 g/dL (ref 6.4–8.2)

## 2018-10-07 LAB — CBC WITH AUTO DIFFERENTIAL
Basophils %: 0 % (ref 0–1)
Basophils Absolute: 0 10*3/uL (ref 0.0–0.1)
Eosinophils %: 1 % (ref 0–7)
Eosinophils Absolute: 0.1 10*3/uL (ref 0.0–0.4)
Granulocyte Absolute Count: 0 10*3/uL (ref 0.00–0.04)
Hematocrit: 43.3 % (ref 35.0–47.0)
Hemoglobin: 14.4 g/dL (ref 11.5–16.0)
Immature Granulocytes: 0 % (ref 0.0–0.5)
Lymphocytes %: 42 % (ref 12–49)
Lymphocytes Absolute: 2.4 10*3/uL (ref 0.8–3.5)
MCH: 30 PG (ref 26.0–34.0)
MCHC: 33.3 g/dL (ref 30.0–36.5)
MCV: 90.2 FL (ref 80.0–99.0)
Monocytes %: 7 % (ref 5–13)
Monocytes Absolute: 0.4 10*3/uL (ref 0.0–1.0)
NRBC Absolute: 0 10*3/uL (ref 0.00–0.01)
Neutrophils %: 50 % (ref 32–75)
Neutrophils Absolute: 2.8 10*3/uL (ref 1.8–8.0)
Nucleated RBCs: 0 PER 100 WBC
Platelets: 133 10*3/uL — ABNORMAL LOW (ref 150–400)
RBC: 4.8 M/uL (ref 3.80–5.20)
RDW: 12.8 % (ref 11.5–14.5)
WBC: 5.7 10*3/uL (ref 3.6–11.0)

## 2018-10-07 NOTE — Telephone Encounter (Addendum)
Patient said that she talked with [facility] and they will not release medical records, they told her you would have to "start all over" doing testing.  She is now vomiting again after she drank Orange juice.  She is insistent on you seeing her.  Please advise.

## 2018-10-07 NOTE — Telephone Encounter (Addendum)
10/07/18  11:37 am-  Spoke with Claris Gower and she asked Trisha Mangle if patient needed to go to ER.  Patient has been advised.  She will actually go to [facility] ER for evaluation.  I recommended [employer] for continuation of care so we would have medical records as well. She is very upset that she was told supposedly by [facility] that she is having miscarriage and her UPT is still positive.  I tried to explain pregnancy hormone levels can still be elevated enough to show a positive UPT.  Still suggested ER visit.

## 2018-10-07 NOTE — ED Provider Notes (Addendum)
This is a 28 year old female who presents ambulatory to the emergency room with complaints of questionable miscarriage.  Patient was seen on May 1 and May 3 at [facility] and was told that she had a missed abortion.  Patient denies any vaginal bleeding.  States that she is approximately [redacted] weeks pregnant with unknown LMP.  Patient denies chest pain, shortness of breath, dizziness, fever or chills.  States that she has had excessive nausea and vomiting that prompted her emergency room visit to [facility].  Patient was sent to this emergency room by her OB/GYN for definitive diagnosis. G6 P5 A0 There are no further complaints at this time.    Consuella Lose, MD  Past Medical History:  2010: ADHD (attention deficit hyperactivity disorder)      Comment:  not currently taking medication  No date: Asthma  No past surgical history on file.             Past Medical History:   Diagnosis Date   ??? ADHD (attention deficit hyperactivity disorder) 2010    not currently taking medication   ??? Asthma        No past surgical history on file.      Family History:   Problem Relation Age of Onset   ??? Sickle Cell Anemia Mother    ??? Asthma Sister    ??? Asthma Brother         bronnchitis   ??? Hypertension Maternal Grandmother    ??? Breast Cancer Maternal Grandmother         breast ca   ??? Asthma Maternal Grandmother    ??? Hypertension Maternal Grandfather    ??? Kidney Disease Maternal Grandfather    ??? Asthma Sister    ??? Asthma Sister        Social History     Socioeconomic History   ??? Marital status: SINGLE     Spouse name: Not on file   ??? Number of children: Not on file   ??? Years of education: Not on file   ??? Highest education level: Not on file   Occupational History   ??? Not on file   Social Needs   ??? Financial resource strain: Not on file   ??? Food insecurity     Worry: Not on file     Inability: Not on file   ??? Transportation needs     Medical: Not on file     Non-medical: Not on file   Tobacco Use    ??? Smoking status: Current Some Day Smoker   ??? Smokeless tobacco: Never Used   Substance and Sexual Activity   ??? Alcohol use: No   ??? Drug use: No   ??? Sexual activity: Yes     Partners: Male   Lifestyle   ??? Physical activity     Days per week: Not on file     Minutes per session: Not on file   ??? Stress: Not on file   Relationships   ??? Social Wellsite geologist on phone: Not on file     Gets together: Not on file     Attends religious service: Not on file     Active member of club or organization: Not on file     Attends meetings of clubs or organizations: Not on file     Relationship status: Not on file   ??? Intimate partner violence     Fear of current or ex partner: Not on  file     Emotionally abused: Not on file     Physically abused: Not on file     Forced sexual activity: Not on file   Other Topics Concern   ??? Not on file   Social History Narrative   ??? Not on file         ALLERGIES: Seafood [shellfish containing products]    Review of Systems   Constitutional: Negative for appetite change, chills, diaphoresis, fatigue and fever.   HENT: Negative for congestion, ear discharge, ear pain, sinus pressure, sinus pain, sore throat and trouble swallowing.    Eyes: Negative for photophobia, pain, redness and visual disturbance.   Respiratory: Negative for chest tightness, shortness of breath and wheezing.    Cardiovascular: Negative for chest pain and palpitations.   Gastrointestinal: Positive for nausea and vomiting. Negative for abdominal distention and abdominal pain.   Endocrine: Negative.    Genitourinary: Negative for difficulty urinating, flank pain, frequency and urgency.   Musculoskeletal: Negative for back pain, neck pain and neck stiffness.   Skin: Negative for color change, pallor, rash and wound.   Allergic/Immunologic: Negative.    Neurological: Negative for dizziness, speech difficulty, weakness and headaches.   Hematological: Does not bruise/bleed easily.    Psychiatric/Behavioral: Negative for behavioral problems. The patient is not nervous/anxious.        Vitals:    10/07/18 1328   BP: 110/67   Pulse: 66   Resp: 20   Temp: 98.5 ??F (36.9 ??C)   SpO2: 97%   Weight: 104.3 kg (230 lb)   Height:  (1.626 m)            Physical Exam  Vitals signs and nursing note reviewed.   Constitutional:       General: She is not in acute distress.     Appearance: Normal appearance. She is well-developed.   HENT:      Head: Normocephalic and atraumatic.      Right Ear: External ear normal.      Left Ear: External ear normal.      Nose: Nose normal.      Mouth/Throat:      Mouth: Mucous membranes are moist.   Eyes:      General:         Right eye: No discharge.         Left eye: No discharge.      Conjunctiva/sclera: Conjunctivae normal.      Pupils: Pupils are equal, round, and reactive to light.   Neck:      Musculoskeletal: Normal range of motion and neck supple.      Vascular: No JVD.      Trachea: No tracheal deviation.   Cardiovascular:      Rate and Rhythm: Normal rate and regular rhythm.      Heart sounds: Normal heart sounds. No murmur. No gallop.    Pulmonary:      Effort: Pulmonary effort is normal. No respiratory distress.      Breath sounds: Normal breath sounds. No wheezing or rales.   Chest:      Chest wall: No tenderness.   Abdominal:      General: Bowel sounds are normal. There is no distension.      Palpations: Abdomen is soft.      Tenderness: There is no abdominal tenderness. There is no guarding or rebound.   Genitourinary:     Vagina: No vaginal discharge.   Musculoskeletal: Normal range of motion.  General: No tenderness.   Skin:     General: Skin is warm and dry.      Capillary Refill: Capillary refill takes less than 2 seconds.      Coloration: Skin is not pale.      Findings: No erythema or rash.   Neurological:      General: No focal deficit present.      Mental Status: She is alert and oriented to person, place, and time.      Motor: No weakness.       Coordination: Coordination normal.   Psychiatric:         Mood and Affect: Mood normal.         Behavior: Behavior normal.         Thought Content: Thought content normal.         Judgment: Judgment normal.          MDM  Number of Diagnoses or Management Options  Nausea/vomiting in pregnancy: new and requires workup  Pregnancy test performed, pregnancy confirmed: new and requires workup  Diagnosis management comments: Plan:     Pregnancy confirmed. Patient with elevated Beta HCG and also ultrasound with known intrauterine pregnancy.     Patient will follow up with PCP and OB/GYN as an outpatient.        Amount and/or Complexity of Data Reviewed  Clinical lab tests: ordered and reviewed  Tests in the radiology section of CPT??: ordered and reviewed      Labs Reviewed   CBC WITH AUTOMATED DIFF - Abnormal; Notable for the following components:       Result Value    PLATELET 133 (*)     All other components within normal limits   METABOLIC PANEL, COMPREHENSIVE - Abnormal; Notable for the following components:    Sodium 134 (*)     BUN/Creatinine ratio 9 (*)     Bilirubin, total 1.2 (*)     Globulin 4.2 (*)     A-G Ratio 1.0 (*)     All other components within normal limits   BETA HCG, QT - Abnormal; Notable for the following components:    Beta HCG, QT 15,215 (*)     All other components within normal limits   BLOOD TYPE, (ABO+RH)     [place] Uts Transvaginal Ob    Result Date: 10/07/2018  IMPRESSION: 1. Single intrauterine pregnancy estimated gestational age [redacted] weeks 0 days. Fetal cardiac activity is observed but a measurable heart rate is difficult due to early gestational age 432. Subchorionic hemorrhage     [place] Preg Uts < 14 Wks Sngl    Result Date: 10/07/2018  IMPRESSION: 1. Single intrauterine pregnancy estimated gestational age [redacted] weeks 0 days. Fetal cardiac activity is observed but a measurable heart rate is difficult due to early gestational age 432. Subchorionic hemorrhage          4:29 PM   Pt has been reexamined.  Pt has no new complaints, changes or physical findings. Care plan outlined and precautions discussed. All available results were reviewed with pt. All medications were reviewed with pt. All of pt's questions and concerns were addressed. Pt agrees to F/U as instructed and agrees to return to ED upon further deterioration. Pt is ready to go home.  Juanda ChanceKathleen M Amil Bouwman, NP      Procedures

## 2018-10-07 NOTE — ED Triage Notes (Signed)
Triage Note: Patient is coming in for abdominal pain. [facility] in the last 5 days. Patient denies any vaginal bleeding. Patient states [facility] says she was miscarrying. + vomiting.

## 2018-10-07 NOTE — Telephone Encounter (Signed)
Patient due for her NOB visit on 10/30/18.  First UPT positive on 09/30/18, has no idea about her LMP dates.    She was seen at [facility] ER twice this weekend.  She went on May 1st and May 3rd.  She does not have records, she states that she signed for release of records.    She had blood work (no idea of results) and ultrasound (showed no heartbeat).    She went to the ER for vomiting and pelvic pain.  No vaginal bleeding. No diarrhea.    The ER told her she was having a miscarriage "but she does not believe them".    Still continues to not have bleeding, pelvic pain has improved and vomiting has stopped, feels better today.    What type of visit do you want?  Labs?

## 2018-10-07 NOTE — Telephone Encounter (Signed)
We need her records first.

## 2018-10-07 NOTE — Telephone Encounter (Signed)
She has done that.  They will not provide the records on the spot.  Do you want her to go back to the ER?  I need more specifics as what you suggest she do from here if they will not release her records.  Ive suggested everything and she said that she has signed the release and they will not just give the records on the spot.

## 2018-10-07 NOTE — ED Notes (Signed)
RN reviewed discharge instructions with patient. Patient verbalized understanding. Time allotted for questions. A&O at time of discharge. VSS. Patient ambulatory off unit.

## 2018-10-07 NOTE — Telephone Encounter (Signed)
Patient has been advised and will work on getting her records and drop them off to [facility] for review.

## 2018-10-07 NOTE — Telephone Encounter (Signed)
I need to review her records. Please have her sign a record release.

## 2018-10-07 NOTE — ED Notes (Signed)
Triage Note: Patient is coming in for abdominal pain. Patient has been seen at [facility] twice in the last 5 days. Patient denies any vaginal bleeding. Patient states [facility] says she was miscarrying. + vomiting.

## 2018-10-07 NOTE — ED Provider Notes (Signed)
ED Provider Notes by Juanda Chance, NP at 10/07/18 1335                Author: Juanda Chance, NP  Service: Emergency Medicine  Author Type: Nurse Practitioner       Filed: 10/07/18 1911  Date of Service: 10/07/18 1335  Status: Attested Addendum          Editor: Cadey Bazile, Magnus Sinning, NP (Nurse Practitioner)       Related Notes: Original Note by Juanda Chance, NP (Nurse Practitioner) filed at 10/07/18  1911          Cosigner: Domingo Cocking, MD at 10/09/18 1938          Attestation signed by Domingo Cocking, MD at 10/09/18 1938          I was personally available for consultation in the emergency department.  I have reviewed the chart and agree with the documentation recorded by the [facility], including  the assessment, treatment plan, and disposition.   Domingo Cocking, MD                                    This is a 28 year old female who presents ambulatory to the emergency room with complaints of questionable miscarriage.   Patient was seen on May 1 and May 3 at [facility] and was told that she had a missed abortion.  Patient denies any vaginal bleeding.  States that she is approximately [redacted] weeks pregnant with unknown LMP.  Patient denies chest pain, shortness of  breath, dizziness, fever or chills.  States that she has had excessive nausea and vomiting that prompted her emergency room visit to [place].  Patient was sent to this emergency room by her OB/GYN for definitive diagnosis. G6 P5 A0 There are no further  complaints at this time.      Consuella Lose, MD   Past Medical History:   2010: ADHD (attention deficit hyperactivity disorder)       Comment:  not currently taking medication   No date: Asthma   No past surgical history on file.                     Past Medical History:        Diagnosis  Date         ?  ADHD (attention deficit hyperactivity disorder)  2010          not currently taking medication         ?  Asthma             No past surgical history on file.          Family  History:         Problem  Relation  Age of Onset          ?  Sickle Cell Anemia  Mother       ?  Asthma  Sister       ?  Asthma  Brother                bronnchitis          ?  Hypertension  Maternal Grandmother       ?  Breast Cancer  Maternal Grandmother                breast ca          ?  Asthma  Maternal Grandmother       ?  Hypertension  Maternal Grandfather       ?  Kidney Disease  Maternal Grandfather       ?  Asthma  Sister            ?  Asthma  Sister               Social History          Socioeconomic History         ?  Marital status:  SINGLE              Spouse name:  Not on file         ?  Number of children:  Not on file     ?  Years of education:  Not on file     ?  Highest education level:  Not on file       Occupational History        ?  Not on file       Social Needs         ?  Financial resource strain:  Not on file        ?  Food insecurity              Worry:  Not on file         Inability:  Not on file        ?  Transportation needs              Medical:  Not on file         Non-medical:  Not on file       Tobacco Use         ?  Smoking status:  Current Some Day Smoker     ?  Smokeless tobacco:  Never Used       Substance and Sexual Activity         ?  Alcohol use:  No     ?  Drug use:  No     ?  Sexual activity:  Yes              Partners:  Male       Lifestyle        ?  Physical activity              Days per week:  Not on file         Minutes per session:  Not on file         ?  Stress:  Not on file       Relationships        ?  Social Engineer, manufacturing systems on phone:  Not on file         Gets together:  Not on file         Attends religious service:  Not on file         Active member of club or organization:  Not on file         Attends meetings of clubs or organizations:  Not on file         Relationship status:  Not on file        ?  Intimate partner violence              Fear of current or ex partner:  Not on file         Emotionally abused:  Not on file         Physically  abused:  Not on file         Forced sexual activity:  Not on file        Other Topics  Concern        ?  Not on file       Social History Narrative        ?  Not on file              ALLERGIES: Seafood [shellfish containing products]      Review of Systems    Constitutional: Negative for appetite change, chills, diaphoresis, fatigue and fever.    HENT: Negative for congestion, ear discharge, ear pain, sinus pressure, sinus pain, sore throat and trouble swallowing.     Eyes: Negative for photophobia, pain, redness and visual disturbance.    Respiratory: Negative for chest tightness, shortness of breath and wheezing.     Cardiovascular: Negative for chest pain and palpitations.    Gastrointestinal: Positive for nausea and vomiting . Negative for abdominal distention and abdominal pain.    Endocrine: Negative.     Genitourinary: Negative for difficulty urinating, flank pain, frequency and urgency.    Musculoskeletal: Negative for back pain, neck pain and neck stiffness.    Skin: Negative for color change, pallor, rash and wound.    Allergic/Immunologic: Negative.     Neurological: Negative for dizziness, speech difficulty, weakness and headaches.    Hematological: Does not bruise/bleed easily.    Psychiatric/Behavioral: Negative for behavioral problems. The patient is not nervous/anxious.             Vitals:          10/07/18 1328        BP:  110/67     Pulse:  66     Resp:  20     Temp:  98.5 ??F (36.9 ??C)     SpO2:  97%     Weight:  104.3 kg (230 lb)        Height:  5\' 4"  (1.626 m)                Physical Exam   Vitals signs and nursing note reviewed.   Constitutional:        General: She is not in acute distress.     Appearance: Normal appearance. She is well-developed.    HENT:       Head: Normocephalic and atraumatic.      Right Ear: External ear normal.      Left Ear: External ear normal.      Nose: Nose normal.      Mouth/Throat:      Mouth: Mucous membranes are moist.   Eyes:       General:         Right eye: No  discharge.         Left eye: No discharge.      Conjunctiva/sclera: Conjunctivae normal.      Pupils: Pupils are equal, round, and reactive to light.   Neck:       Musculoskeletal: Normal range of motion and neck supple.      Vascular: No JVD.      Trachea: No tracheal deviation.    Cardiovascular:       Rate and Rhythm: Normal rate and regular rhythm.      Heart  sounds: Normal heart sounds. No murmur. No gallop.     Pulmonary:       Effort: Pulmonary effort is normal. No respiratory distress.      Breath sounds: Normal breath sounds. No wheezing or rales.   Chest:       Chest wall: No tenderness.   Abdominal :      General: Bowel sounds are normal. There is no distension.      Palpations: Abdomen is soft.      Tenderness: There is no abdominal tenderness. There is no guarding or rebound.    Genitourinary :      Vagina: No vaginal discharge.   Musculoskeletal : Normal range of motion.          General: No tenderness.    Skin:      General: Skin is warm and dry.      Capillary Refill: Capillary refill takes less than 2 seconds.      Coloration: Skin is not pale.      Findings: No erythema or rash.   Neurological:       General: No focal deficit present.      Mental Status: She is alert and oriented to person, place, and time.      Motor: No weakness.      Coordination: Coordination normal.   Psychiatric:         Mood and Affect: Mood normal.         Behavior: Behavior  normal.         Thought Content: Thought content normal.         Judgment: Judgment normal.              MDM   Number of Diagnoses or Management Options   Nausea/vomiting in pregnancy: new and requires workup   Pregnancy test performed, pregnancy confirmed: new and requires workup   Diagnosis management comments: Plan:       Pregnancy confirmed. Patient with elevated Beta HCG and also ultrasound with known intrauterine pregnancy.       Patient will follow up with PCP and OB/GYN as an outpatient.           Amount and/or Complexity of Data Reviewed    Clinical lab tests: ordered and reviewed   Tests in the radiology section of CPT??: ordered and reviewed           Labs Reviewed       CBC WITH AUTOMATED DIFF - Abnormal; Notable for the following components:            Result  Value            PLATELET  133 (*)            All other components within normal limits       METABOLIC PANEL, COMPREHENSIVE - Abnormal; Notable for the following components:            Sodium  134 (*)         BUN/Creatinine ratio  9 (*)         Bilirubin, total  1.2 (*)         Globulin  4.2 (*)         A-G Ratio  1.0 (*)            All other components within normal limits       BETA HCG, QT - Abnormal; Notable for the following components:  Beta HCG, QT  15,215 (*)            All other components within normal limits       BLOOD TYPE, (ABO+RH)        [place] Uts Transvaginal Ob      Result Date: 10/07/2018   IMPRESSION: 1. Single intrauterine pregnancy estimated gestational age [redacted] weeks 0 days. Fetal cardiac activity is observed but a measurable heart rate is difficult due to early gestational age 82. Subchorionic hemorrhage       [place] Preg Uts < 14 Wks Sngl      Result Date: 10/07/2018   IMPRESSION: 1. Single intrauterine pregnancy estimated gestational age [redacted] weeks 0 days. Fetal cardiac activity is observed but a measurable heart rate is difficult due to early gestational age 82. Subchorionic hemorrhage              4:29 PM   Pt has been reexamined.  Pt has no new complaints, changes or physical findings. Care plan outlined and precautions discussed. All available results were reviewed with pt. All medications were reviewed with pt. All of pt's questions and concerns were  addressed. Pt agrees to F/U as instructed and agrees to return to ED upon further deterioration. Pt is ready to go home.   Juanda Chance, NP         Procedures

## 2018-10-08 LAB — BLOOD TYPE, (ABO+RH)
ABO/Rh(D): O POS
ABO/Rh: O POS

## 2018-10-10 DIAGNOSIS — O212 Late vomiting of pregnancy: Secondary | ICD-10-CM

## 2018-10-10 NOTE — ED Provider Notes (Signed)
Please note that this dictation was completed with Dragon, the computer voice recognition software. ??Quite often unanticipated grammatical, syntax, homophones, and other interpretive errors are inadvertently transcribed by the computer software. ??Please disregard these errors. ??Please excuse any errors that have escaped final proofreading.  ??    28 year old female presents the emergency room for evaluation of nausea and vomiting.  Patient is [redacted] weeks pregnant.  Patient reports that at 1 PM today she started vomiting.  She has continued vomiting through the afternoon into tonight.  Patient is taking Zofran with no relief.  She is continuing to make urine.  No dysuria frequency urgency.  No abdominal cramping or vaginal bleeding.  Patient was seen in this emergency room 2 days ago and had an ultrasound that showed an IUP.  No diarrhea.  No chest pain or shortness of breath.  No dizziness or lightheadedness.  No alleviating factors.  This is the patient's fourth visit to the emergency room in the month of May.  No cough, congestion, runny nose or sore throats. Pt reports taking zofran at home with no relief.    LMP: unknown  G6P5.    Social hx  +smoker  No alcohol    Ob/gyn: Trisha Mangle    The history is provided by the patient. No language interpreter was used.   Nausea    Pertinent negatives include no chills, no fever, no abdominal pain, no headaches, no cough and no headaches.        Past Medical History:   Diagnosis Date   ??? ADHD (attention deficit hyperactivity disorder) 2010    not currently taking medication   ??? Asthma        History reviewed. No pertinent surgical history.      Family History:   Problem Relation Age of Onset   ??? Sickle Cell Anemia Mother    ??? Asthma Sister    ??? Asthma Brother         bronnchitis   ??? Hypertension Maternal Grandmother    ??? Breast Cancer Maternal Grandmother         breast ca   ??? Asthma Maternal Grandmother    ??? Hypertension Maternal Grandfather     ??? Kidney Disease Maternal Grandfather    ??? Asthma Sister    ??? Asthma Sister        Social History     Socioeconomic History   ??? Marital status: SINGLE     Spouse name: Not on file   ??? Number of children: Not on file   ??? Years of education: Not on file   ??? Highest education level: Not on file   Occupational History   ??? Not on file   Social Needs   ??? Financial resource strain: Not on file   ??? Food insecurity     Worry: Not on file     Inability: Not on file   ??? Transportation needs     Medical: Not on file     Non-medical: Not on file   Tobacco Use   ??? Smoking status: Current Some Day Smoker   ??? Smokeless tobacco: Never Used   Substance and Sexual Activity   ??? Alcohol use: No   ??? Drug use: No   ??? Sexual activity: Yes     Partners: Male   Lifestyle   ??? Physical activity     Days per week: Not on file     Minutes per session: Not on file   ??? Stress: Not  on file   Relationships   ??? Social Wellsite geologistconnections     Talks on phone: Not on file     Gets together: Not on file     Attends religious service: Not on file     Active member of club or organization: Not on file     Attends meetings of clubs or organizations: Not on file     Relationship status: Not on file   ??? Intimate partner violence     Fear of current or ex partner: Not on file     Emotionally abused: Not on file     Physically abused: Not on file     Forced sexual activity: Not on file   Other Topics Concern   ??? Not on file   Social History Narrative   ??? Not on file         ALLERGIES: Lortab [hydrocodone-acetaminophen]; Penicillins; Seafood [shellfish containing products]; and Tramadol    Review of Systems   Constitutional: Negative for chills and fever.   HENT: Negative for congestion and sore throat.    Respiratory: Negative for cough, chest tightness and shortness of breath.    Cardiovascular: Negative for chest pain.   Gastrointestinal: Positive for nausea and vomiting. Negative for abdominal pain.    Genitourinary: Negative for difficulty urinating, dysuria and vaginal bleeding.   Musculoskeletal: Negative for back pain and neck pain.   Skin: Negative for color change and rash.   Neurological: Negative for dizziness, weakness, numbness and headaches.   All other systems reviewed and are negative.      Vitals:    10/10/18 2233   BP: 119/70   Pulse: 69   Resp: 18   Temp: 98.2 ??F (36.8 ??C)   SpO2: 99%            Physical Exam  Vitals signs and nursing note reviewed.   Constitutional:       General: She is not in acute distress.     Appearance: Normal appearance. She is normal weight. She is not toxic-appearing.   HENT:      Head: Normocephalic and atraumatic.      Nose: Nose normal.      Mouth/Throat:      Mouth: Mucous membranes are moist.   Eyes:      Conjunctiva/sclera: Conjunctivae normal.      Pupils: Pupils are equal, round, and reactive to light.   Neck:      Musculoskeletal: Normal range of motion and neck supple.   Cardiovascular:      Rate and Rhythm: Normal rate and regular rhythm.   Pulmonary:      Effort: Pulmonary effort is normal.      Breath sounds: Normal breath sounds.   Abdominal:      General: Abdomen is flat. There is no distension.      Palpations: There is no mass.      Tenderness: There is no abdominal tenderness. There is no right CVA tenderness, left CVA tenderness, guarding or rebound.      Hernia: No hernia is present.   Musculoskeletal: Normal range of motion.   Skin:     General: Skin is warm and dry.   Neurological:      General: No focal deficit present.      Mental Status: She is alert and oriented to person, place, and time.   Psychiatric:         Mood and Affect: Mood normal.  Behavior: Behavior normal.         Thought Content: Thought content normal.         Judgment: Judgment normal.          MDM  Number of Diagnoses or Management Options  Acute cystitis without hematuria:   Intractable vomiting with nausea, unspecified vomiting type:    Diagnosis management comments: Assessment and plan: This is a 28 year old female presenting to the emergency room for nausea with vomiting since this afternoon.  Patient is [redacted] weeks pregnant via ultrasound done 4 days ago.  Patient is afebrile.  Her lungs are clear.  Her abdomen is soft and nontender.  She is vomiting into the emesis bag currently.  She is nontoxic-appearing.  Plan: IV fluid, Reglan, labs urinalysis    2:00 AM  Patient has had fluid, Reglan and Zofran.  She is currently vomiting again over the side of the bed.  We have discussed with OB hospitalist.  She will be down to see patient when she is able to as they are busy at this time.  Pt in agreement of plan for admission.    3:02 AM  Pt has been seen and evaluated by ob hospitalist. She graciously will admit.         Amount and/or Complexity of Data Reviewed  Discuss the patient with other providers: yes (ER attending-Knott)    Patient Progress  Patient progress: stable         Procedures      Pt case including HPI, PE, and all available lab and radiology results has been discussed with attending physician. Opportunity to evaluate patient has been provided to ER attending.

## 2018-10-10 NOTE — ED Triage Notes (Signed)
Patient arrives via EMS for nausea and vomiting since 1pm.  Patient reports 6 weeks 3 days pregnant followed by Dr. Diaz.  This is 5th pregnancy for patient.

## 2018-10-10 NOTE — ED Notes (Signed)
Patient arrives via EMS for nausea and vomiting since 1pm.  Patient reports 6 weeks 3 days pregnant followed by Dr. Trisha Mangle.  This is 5th pregnancy for patient.

## 2018-10-10 NOTE — ED Provider Notes (Signed)
ED Provider Notes by Massie Kluver, PA-C at 10/10/18 2309                Author: Massie Kluver, PA-C  Service: Emergency Medicine  Author Type: Physician Assistant       Filed: 10/11/18 0303  Date of Service: 10/10/18 2309  Status: Attested           Editor: Lorne Skeens (Physician Assistant)  Cosigner: Truett Mainland, MD at 10/11/18 [phone]          Attestation signed by Truett Mainland, MD at 10/11/18 [phone]          I personally saw and examined the patient.  I have reviewed and agree with the MLP's findings, including all diagnostic interpretations, and plans as written.    I was present during the key portions of separately billed procedures.     Truett Mainland, MD       Suspect cannabinoid hyperemesis in combination with hyperemesis gravidarum. Advised to stop using marijuana.                                  Please note that this dictation was completed with Dragon, the computer voice recognition software. ??Quite often  unanticipated grammatical, syntax, homophones, and other interpretive errors are inadvertently transcribed by the computer software. ??Please disregard these errors. ??Please excuse any errors that have escaped final proofreading.   ??      28 year old female presents the emergency room for evaluation of nausea and vomiting.  Patient is [redacted] weeks pregnant.  Patient reports that at 1 PM today she started vomiting.  She has continued vomiting through the afternoon into tonight.  Patient is taking  Zofran with no relief.  She is continuing to make urine.  No dysuria frequency urgency.  No abdominal cramping or vaginal bleeding.  Patient was seen in this emergency room 2 days ago and had an ultrasound that showed an IUP.  No diarrhea.  No chest pain  or shortness of breath.  No dizziness or lightheadedness.  No alleviating factors.  This is the patient's fourth visit to the emergency room in the month of May.  No cough, congestion, runny nose or sore throats. Pt reports taking  zofran at home with  no relief.      LMP: unknown   G6P5.      Social hx   +smoker   No alcohol      Ob/gyn: Trisha Mangle      The history is provided by the patient. No language interpreter  was used.    Nausea     Pertinent negatives include no chills, no fever, no abdominal pain, no headaches, no cough and no headaches.             Past Medical History:        Diagnosis  Date         ?  ADHD (attention deficit hyperactivity disorder)  2010          not currently taking medication         ?  Asthma             History reviewed. No pertinent surgical history.          Family History:         Problem  Relation  Age of Onset          ?  Sickle Cell Anemia  Mother       ?  Asthma  Sister       ?  Asthma  Brother                bronnchitis          ?  Hypertension  Maternal Grandmother       ?  Breast Cancer  Maternal Grandmother                breast ca          ?  Asthma  Maternal Grandmother       ?  Hypertension  Maternal Grandfather       ?  Kidney Disease  Maternal Grandfather       ?  Asthma  Sister            ?  Asthma  Sister               Social History          Socioeconomic History         ?  Marital status:  SINGLE              Spouse name:  Not on file         ?  Number of children:  Not on file     ?  Years of education:  Not on file     ?  Highest education level:  Not on file       Occupational History        ?  Not on file       Social Needs         ?  Financial resource strain:  Not on file        ?  Food insecurity              Worry:  Not on file         Inability:  Not on file        ?  Transportation needs              Medical:  Not on file         Non-medical:  Not on file       Tobacco Use         ?  Smoking status:  Current Some Day Smoker     ?  Smokeless tobacco:  Never Used       Substance and Sexual Activity         ?  Alcohol use:  No     ?  Drug use:  No     ?  Sexual activity:  Yes              Partners:  Male       Lifestyle        ?  Physical activity              Days per week:  Not on file          Minutes per session:  Not on file         ?  Stress:  Not on file       Relationships        ?  Social Engineer, manufacturing systems on phone:  Not on file         Gets together:  Not on file         Attends religious service:  Not on file         Active member of club or organization:  Not on file         Attends meetings of clubs or organizations:  Not on file         Relationship status:  Not on file        ?  Intimate partner violence              Fear of current or ex partner:  Not on file         Emotionally abused:  Not on file         Physically abused:  Not on file         Forced sexual activity:  Not on file        Other Topics  Concern        ?  Not on file       Social History Narrative        ?  Not on file              ALLERGIES: Lortab [hydrocodone-acetaminophen]; Penicillins; Seafood [shellfish containing products]; and Tramadol      Review of Systems    Constitutional: Negative for chills and fever.    HENT: Negative for congestion and sore throat.     Respiratory: Negative for cough, chest tightness and shortness of breath.     Cardiovascular: Negative for chest pain.    Gastrointestinal: Positive for nausea and vomiting . Negative for abdominal pain.    Genitourinary: Negative for difficulty urinating, dysuria and vaginal bleeding.    Musculoskeletal: Negative for back pain and neck pain.    Skin: Negative for color change and rash.    Neurological: Negative for dizziness, weakness, numbness and headaches.    All other systems reviewed and are negative.           Vitals:          10/10/18 2233        BP:  119/70     Pulse:  69     Resp:  18     Temp:  98.2 ??F (36.8 ??C)        SpO2:  99%                Physical Exam   Vitals signs and nursing note reviewed.   Constitutional:        General: She is not in acute distress.     Appearance: Normal appearance. She is normal weight. She is not toxic-appearing.    HENT:       Head: Normocephalic and atraumatic.      Nose: Nose normal.       Mouth/Throat:      Mouth: Mucous membranes are moist.    Eyes:       Conjunctiva/sclera: Conjunctivae normal.      Pupils: Pupils are equal, round, and reactive to light.    Neck:       Musculoskeletal: Normal range of motion and neck supple.   Cardiovascular :       Rate and Rhythm: Normal rate and regular rhythm.   Pulmonary :       Effort: Pulmonary effort is normal.      Breath sounds: Normal breath sounds.   Abdominal :      General: Abdomen is flat. There is no distension.  Palpations: There is no mass.      Tenderness: There is no abdominal tenderness. There is no right CVA tenderness, left CVA tenderness, guarding or rebound.      Hernia: No  hernia is present.     Musculoskeletal: Normal range of motion.    Skin:      General: Skin is warm and dry.   Neurological :       General: No focal deficit present.      Mental Status: She is alert and oriented to person, place, and time.    Psychiatric:         Mood and Affect: Mood normal.         Behavior: Behavior normal.         Thought Content: Thought content normal.         Judgment: Judgment normal.              MDM   Number of Diagnoses or Management Options   Acute cystitis without hematuria:    Intractable vomiting with nausea, unspecified vomiting type:    Diagnosis management comments: Assessment and plan: This is a 28 year old female presenting to the emergency room for nausea with vomiting since this afternoon.  Patient is [redacted] weeks pregnant via  ultrasound done 4 days ago.  Patient is afebrile.  Her lungs are clear.  Her abdomen is soft and nontender.  She is vomiting into the emesis bag currently.  She is nontoxic-appearing.   Plan: IV fluid, Reglan, labs urinalysis      2:00 AM   Patient has had fluid, Reglan and Zofran.  She is currently vomiting again over the side of the bed.  We have discussed with OB hospitalist.  She will be down to see patient when she is able to as they are busy at this time.   Pt in agreement of plan for admission.       3:02 AM   Pt has been seen and evaluated by ob hospitalist. She graciously will admit.             Amount and/or Complexity of Data Reviewed   Discuss the patient with other providers: yes (ER attending-Knott)      Patient Progress   Patient progress: stable             Procedures         Pt case including HPI, PE, and all available lab and radiology results has been discussed with attending physician. Opportunity to evaluate patient has been provided to ER attending.

## 2018-10-11 ENCOUNTER — Inpatient Hospital Stay
Admit: 2018-10-11 | Discharge: 2018-10-11 | Disposition: A | Discharge status: Home / Self Care | Payer: MEDICAID | Attending: Emergency Medicine

## 2018-10-11 LAB — METABOLIC PANEL, COMPREHENSIVE
A-G Ratio: 1 — ABNORMAL LOW (ref 1.1–2.2)
ALT (SGPT): 39 U/L (ref 12–78)
AST (SGOT): 26 U/L (ref 15–37)
Albumin: 4.1 g/dL (ref 3.5–5.0)
Alk. phosphatase: 60 U/L (ref 45–117)
Anion gap: 11 mmol/L (ref 5–15)
BUN/Creatinine ratio: 9 — ABNORMAL LOW (ref 12–20)
BUN: 6 MG/DL (ref 6–20)
Bilirubin, total: 1 MG/DL (ref 0.2–1.0)
CO2: 17 mmol/L — ABNORMAL LOW (ref 21–32)
Calcium: 9.9 MG/DL (ref 8.5–10.1)
Chloride: 105 mmol/L (ref 97–108)
Creatinine: 0.7 MG/DL (ref 0.55–1.02)
GFR est AA: >60 mL/min/{1.73_m2} (ref >60)
GFR est non-AA: >60 mL/min/{1.73_m2} (ref >60)
Globulin: 4 g/dL (ref 2.0–4.0)
Glucose: 130 mg/dL — ABNORMAL HIGH (ref 65–100)
Potassium: 4 mmol/L (ref 3.5–5.1)
Protein, total: 8.1 g/dL (ref 6.4–8.2)
Sodium: 133 mmol/L — ABNORMAL LOW (ref 136–145)

## 2018-10-11 LAB — CBC WITH AUTOMATED DIFF
ABS. BASOPHILS: 0 10*3/uL (ref 0.0–0.1)
ABS. EOSINOPHILS: 0 10*3/uL (ref 0.0–0.4)
ABS. IMM. GRANS.: 0 10*3/uL (ref 0.00–0.04)
ABS. LYMPHOCYTES: 1.6 10*3/uL (ref 0.8–3.5)
ABS. MONOCYTES: 0.4 10*3/uL (ref 0.0–1.0)
ABS. NEUTROPHILS: 4.3 10*3/uL (ref 1.8–8.0)
ABSOLUTE NRBC: 0 10*3/uL (ref 0.00–0.01)
BASOPHILS: 0 % (ref 0–1)
EOSINOPHILS: 0 % (ref 0–7)
HCT: 45.9 % (ref 35.0–47.0)
HGB: 15 g/dL (ref 11.5–16.0)
IMMATURE GRANULOCYTES: 0 % (ref 0.0–0.5)
LYMPHOCYTES: 25 % (ref 12–49)
MCH: 28.6 PG (ref 26.0–34.0)
MCHC: 32.7 g/dL (ref 30.0–36.5)
MCV: 87.4 FL (ref 80.0–99.0)
MONOCYTES: 6 % (ref 5–13)
NEUTROPHILS: 68 % (ref 32–75)
NRBC: 0 PER 100 WBC
PLATELET: 145 10*3/uL — ABNORMAL LOW (ref 150–400)
RBC: 5.25 M/uL — ABNORMAL HIGH (ref 3.80–5.20)
RDW: 12.8 % (ref 11.5–14.5)
WBC: 6.2 10*3/uL (ref 3.6–11.0)

## 2018-10-11 LAB — URINALYSIS W/MICROSCOPIC
Bilirubin: NEGATIVE
Glucose: NEGATIVE mg/dL
Ketone: >80 mg/dL — AB
Nitrites: NEGATIVE
Protein: 30 mg/dL — AB
Specific gravity: 1.02 (ref 1.003–1.030)
Urobilinogen: 1 EU/dL (ref 0.2–1.0)
pH (UA): 7.5 (ref 5.0–8.0)

## 2018-10-11 LAB — DRUG SCREEN, URINE
AMPHETAMINES: NEGATIVE
Amphetamine Screen, Urine: NEGATIVE
BARBITURATES: NEGATIVE
BENZODIAZEPINES: NEGATIVE
Barbiturate Screen, Urine: NEGATIVE
Benzodiazepine Screen, Urine: NEGATIVE
COCAINE: NEGATIVE
Cocaine Screen Urine: NEGATIVE
METHADONE: NEGATIVE
Methadone Screen, Urine: NEGATIVE
OPIATES: NEGATIVE
Opiate Screen, Urine: NEGATIVE
PCP Screen, Urine: NEGATIVE
PCP(PHENCYCLIDINE): NEGATIVE
THC (TH-CANNABINOL): POSITIVE — AB
THC Screen, Urine: POSITIVE — AB

## 2018-10-11 LAB — BETA HCG, QT
Beta HCG, QT: 27587 m[IU]/mL — ABNORMAL HIGH (ref 0–6)
hCG Quant: 27587 m[IU]/mL — ABNORMAL HIGH (ref 0–6)

## 2018-10-11 LAB — LIPASE
Lipase: 94 U/L (ref 73–393)
Lipase: 94 U/L (ref 73–393)

## 2018-10-11 LAB — SAMPLES BEING HELD

## 2018-10-11 LAB — URINE CULTURE HOLD SAMPLE

## 2018-10-11 LAB — CBC WITH AUTO DIFFERENTIAL
Basophils %: 0 % (ref 0–1)
Basophils Absolute: 0 10*3/uL (ref 0.0–0.1)
Eosinophils %: 0 % (ref 0–7)
Eosinophils Absolute: 0 10*3/uL (ref 0.0–0.4)
Granulocyte Absolute Count: 0 10*3/uL (ref 0.00–0.04)
Hematocrit: 45.9 % (ref 35.0–47.0)
Hemoglobin: 15 g/dL (ref 11.5–16.0)
Immature Granulocytes: 0 % (ref 0.0–0.5)
Lymphocytes %: 25 % (ref 12–49)
Lymphocytes Absolute: 1.6 10*3/uL (ref 0.8–3.5)
MCH: 28.6 PG (ref 26.0–34.0)
MCHC: 32.7 g/dL (ref 30.0–36.5)
MCV: 87.4 FL (ref 80.0–99.0)
Monocytes %: 6 % (ref 5–13)
Monocytes Absolute: 0.4 10*3/uL (ref 0.0–1.0)
NRBC Absolute: 0 10*3/uL (ref 0.00–0.01)
Neutrophils %: 68 % (ref 32–75)
Neutrophils Absolute: 4.3 10*3/uL (ref 1.8–8.0)
Nucleated RBCs: 0 PER 100 WBC
Platelets: 145 10*3/uL — ABNORMAL LOW (ref 150–400)
RBC: 5.25 M/uL — ABNORMAL HIGH (ref 3.80–5.20)
RDW: 12.8 % (ref 11.5–14.5)
WBC: 6.2 10*3/uL (ref 3.6–11.0)

## 2018-10-11 LAB — URINALYSIS WITH MICROSCOPIC
Bilirubin, Urine: NEGATIVE
Glucose, Ur: NEGATIVE mg/dL
Ketones, Urine: >80 mg/dL — AB
Nitrite, Urine: NEGATIVE
Protein, UA: 30 mg/dL — AB
Specific Gravity, UA: 1.02 (ref 1.003–1.030)
Urobilinogen, UA, POCT: 1 EU/dL (ref 0.2–1.0)
pH, UA: 7.5 (ref 5.0–8.0)

## 2018-10-11 LAB — COMPREHENSIVE METABOLIC PANEL
ALT: 39 U/L (ref 12–78)
AST: 26 U/L (ref 15–37)
Albumin/Globulin Ratio: 1 — ABNORMAL LOW (ref 1.1–2.2)
Albumin: 4.1 g/dL (ref 3.5–5.0)
Alkaline Phosphatase: 60 U/L (ref 45–117)
Anion Gap: 11 mmol/L (ref 5–15)
BUN: 6 MG/DL (ref 6–20)
Bun/Cre Ratio: 9 — ABNORMAL LOW (ref 12–20)
CO2: 17 mmol/L — ABNORMAL LOW (ref 21–32)
Calcium: 9.9 MG/DL (ref 8.5–10.1)
Chloride: 105 mmol/L (ref 97–108)
Creatinine: 0.7 MG/DL (ref 0.55–1.02)
EGFR IF NonAfrican American: >60 mL/min/{1.73_m2} (ref >60)
GFR African American: >60 mL/min/{1.73_m2} (ref >60)
Globulin: 4 g/dL (ref 2.0–4.0)
Glucose: 130 mg/dL — ABNORMAL HIGH (ref 65–100)
Potassium: 4 mmol/L (ref 3.5–5.1)
Sodium: 133 mmol/L — ABNORMAL LOW (ref 136–145)
Total Bilirubin: 1 MG/DL (ref 0.2–1.0)
Total Protein: 8.1 g/dL (ref 6.4–8.2)

## 2018-10-11 MED ORDER — METOCLOPRAMIDE 5 MG/ML IJ SOLN
5 mg/mL | INTRAMUSCULAR | Status: AC
Start: 2018-10-11 — End: 2018-10-10
  Administered 2018-10-11: 03:00:00 via INTRAVENOUS

## 2018-10-11 MED ORDER — SODIUM CHLORIDE 0.9 % IJ SYRG
Freq: Three times a day (TID) | INTRAMUSCULAR | Status: DC
Start: 2018-10-11 — End: 2018-10-12
  Administered 2018-10-12 (×2): via INTRAVENOUS

## 2018-10-11 MED ORDER — SODIUM CHLORIDE 0.9 % IJ SYRG
INTRAMUSCULAR | Status: DC | PRN
Start: 2018-10-11 — End: 2018-10-12

## 2018-10-11 MED ORDER — LACTATED RINGERS IV
INTRAVENOUS | Status: DC
Start: 2018-10-11 — End: 2018-10-12
  Administered 2018-10-11: 11:00:00 via INTRAVENOUS

## 2018-10-11 MED ORDER — SODIUM CHLORIDE 0.9 % IV
Freq: Once | INTRAVENOUS | Status: AC
Start: 2018-10-11 — End: 2018-10-11
  Administered 2018-10-11: 05:00:00 via INTRAVENOUS

## 2018-10-11 MED ORDER — PROMETHAZINE IN NS 12.5 MG/50 ML IV PIGGY BAG
12.5 mg/50 ml | Freq: Four times a day (QID) | INTRAVENOUS | Status: DC | PRN
Start: 2018-10-11 — End: 2018-10-12
  Administered 2018-10-11: 11:00:00 via INTRAVENOUS

## 2018-10-11 MED ORDER — ONDANSETRON (PF) 4 MG/2 ML INJECTION
4 mg/2 mL | Freq: Once | INTRAMUSCULAR | Status: AC
Start: 2018-10-11 — End: 2018-10-11
  Administered 2018-10-11: 05:00:00 via INTRAVENOUS

## 2018-10-11 MED ORDER — SODIUM CHLORIDE 0.9 % INJECTION
20 mg/2 mL | INTRAMUSCULAR | Status: AC
Start: 2018-10-11 — End: 2018-10-11
  Administered 2018-10-11: 05:00:00 via INTRAVENOUS

## 2018-10-11 MED ORDER — SODIUM CHLORIDE 0.9 % INJECTION
202 mg/2 mL | Freq: Two times a day (BID) | INTRAMUSCULAR | Status: DC
Start: 2018-10-11 — End: 2018-10-12
  Administered 2018-10-11 – 2018-10-12 (×3): via INTRAVENOUS

## 2018-10-11 MED ORDER — ACETAMINOPHEN 500 MG TAB
500 mg | ORAL | Status: AC
Start: 2018-10-11 — End: 2018-10-11
  Administered 2018-10-11: 05:00:00 via ORAL

## 2018-10-11 MED ORDER — ONDANSETRON (PF) 4 MG/2 ML INJECTION
4 mg/2 mL | INTRAMUSCULAR | Status: DC | PRN
Start: 2018-10-11 — End: 2018-10-12
  Administered 2018-10-11 – 2018-10-12 (×3): via INTRAVENOUS

## 2018-10-11 MED FILL — SODIUM CHLORIDE 0.9 % IV: INTRAVENOUS | Qty: 1000

## 2018-10-11 MED FILL — ONDANSETRON (PF) 4 MG/2 ML INJECTION: 4 mg/2 mL | INTRAMUSCULAR | Qty: 2

## 2018-10-11 MED FILL — PROMETHAZINE IN NS 12.5 MG/50 ML IV PIGGY BAG: 12.5 mg/50 ml | INTRAVENOUS | Qty: 50

## 2018-10-11 MED FILL — MONOJECT PREFILL ADVANCED 0.9 % SODIUM CHLORIDE INJECTION SYRINGE: INTRAMUSCULAR | Qty: 40

## 2018-10-11 MED FILL — FAMOTIDINE (PF) 20 MG/2 ML IV: 20 mg/2 mL | INTRAVENOUS | Qty: 2

## 2018-10-11 MED FILL — NORMAL SALINE FLUSH 0.9 % INJECTION SYRINGE: INTRAMUSCULAR | Qty: 10

## 2018-10-11 MED FILL — MAPAP EXTRA STRENGTH 500 MG TABLET: 500 mg | ORAL | Qty: 2

## 2018-10-11 MED FILL — METOCLOPRAMIDE 5 MG/ML IJ SOLN: 5 mg/mL | INTRAMUSCULAR | Qty: 2

## 2018-10-11 MED FILL — LACTATED RINGERS IV: INTRAVENOUS | Qty: 1000

## 2018-10-11 NOTE — ED Notes (Signed)
TRANSFER - OUT REPORT:    Verbal report given to RN(name) on Wendy Bell  being transferred to anti partum(unit) for routine progression of care       Report consisted of patient???s Situation, Background, Assessment and   Recommendations(SBAR).     Information from the following report(s) ED Summary was reviewed with the receiving nurse.    Lines:   Peripheral IV 10/11/18 Left Antecubital (Active)   Phlebitis Assessment 0 10/11/2018 12:31 AM   Dressing Status Clean, dry, & intact 10/11/2018 12:31 AM   Dressing Type Transparent 10/11/2018 12:31 AM   Hub Color/Line Status Pink 10/11/2018 12:31 AM        Opportunity for questions and clarification was provided.      Patient transported with:   Registered Nurse

## 2018-10-11 NOTE — Progress Notes (Addendum)
TRANSFER - IN REPORT:    Verbal report received from Deanna, RN(name) on Wendy Bell  being received from ED(unit) for routine progression of care      Report consisted of patient???s Situation, Background, Assessment and   Recommendations(SBAR).     Information from the following report(s) SBAR was reviewed with the receiving nurse.    Opportunity for questions and clarification was provided.      Assessment completed upon patient???s arrival to unit and care assumed.     0650:  Patient arrived on unit.  VS & initial assessment performed.  0653:  Pt vomited 300 cc clear fluid  0712:  Spoke with Dr. Dalke for IVF order, MD ordere LR @ 125ml/hr.

## 2018-10-11 NOTE — Progress Notes (Signed)
Bedside and Verbal shift change report given to A. Kirton RN (oncoming nurse) by M. Solivet RN (offgoing nurse). Report included the following information SBAR, Kardex, Intake/Output, MAR and Recent Results.

## 2018-10-11 NOTE — Progress Notes (Signed)
Pt wanted to know if Dr Diaz is coming to see her, Dr Diaz called all calls will be connected to hospitalist Dr Pradham called, updated about HR and total vomitus today= 200 cc and Phenergan med with opoid sedation scale of 3.

## 2018-10-11 NOTE — ED Notes (Signed)
Bedside and Verbal shift change report given to Diana RN (oncoming nurse) by Sam B. RN (offgoing nurse). Report included the following information SBAR and ED Summary.

## 2018-10-11 NOTE — H&P (Signed)
Antepartum History and Physical    Name: Wendy Bell MRN: [id number] SSN: [id number]    Date of Birth: [birth year]  Age: 28 y.o.  Sex: female       Subjective:      Chief complaint:  Nausea and vomiting in pregnancy    Wendy Bell is a 28 y.o.  G5P6 who is 6 weeks 4 days pregnant dated by an ultrasound done on 10/07/2018. She presents to the ER for nausea and vomiting which she reports began when she found out she was pregnant. She says today the vomiting started at 1pm and has been relentless. This is her 4th visit to an ER for treatment. She was seen at this ER before as well as [facility] "a few days ago" where she said she was diagnosed with a UTI as well as a miscarriage. She did not fill the Rx given because she did not believe them. She reports she was given Zofran which she did fill and has been taking it, though the symptoms have not stopped. She reports she has had to use marijuana 2x daily just to be able to eat anything. Initially, when I entered the room she was resting comfortably and quietly. As the interview progressed she started asking if she was going to be given pain medication. She was informed not prior to the evaluation. She then progressed to wailing and crying from what she described as stabbing pelvic pain. Interview was paused, abdomen was examined and was soft without rebound or guarding. Her reaction appeared out of proportion to the exam. Ultrasound from 10/07/2018 was reviewed and documented one IUP with a subchorionic hemorrhage noted, but no adnexal masses or free fluid. Beta-hcg on 10/07/2018 was 15,215, on 10/11/2018 it was 27,587. She reports receiving her prenatal care with Dr. Ty Hilts at [facility].    Patient denies vaginal bleeding or increased vaginal discharge or dysuria. Denies HA, visual changes, SOB or recent illness.  Patient reports nausea and vomiting (has vomited bile over the side of the bed) and "stabbing abdominal pain.     PMH: Patient reports she has been diagnosed with intracranial hypertension which was diagnosed in 2015 after the birth of her twins. She says she is to take medication for it but doesn't. She denies any other PMH, though ADHD and asthma are documented in the EMR.    PSH: C/S x 1 for her twins      OB History     Gravida   1    Para        Term        Preterm        AB        Living           SAB        TAB        Ectopic        Molar        Multiple        Live Births                  Past Medical History:   Diagnosis Date   ??? ADHD (attention deficit hyperactivity disorder) 2010    not currently taking medication   ??? Asthma      History reviewed. No pertinent surgical history.  Social History     Occupational History   ??? Not on file   Tobacco Use   ??? Smoking status: Current Some Day Smoker   ???  Smokeless tobacco: Never Used   Substance and Sexual Activity   ??? Alcohol use: No   ??? Drug use: No   ??? Sexual activity: Yes     Partners: Male     Family History   Problem Relation Age of Onset   ??? Sickle Cell Anemia Mother    ??? Asthma Sister    ??? Asthma Brother         bronnchitis   ??? Hypertension Maternal Grandmother    ??? Breast Cancer Maternal Grandmother         breast ca   ??? Asthma Maternal Grandmother    ??? Hypertension Maternal Grandfather    ??? Kidney Disease Maternal Grandfather    ??? Asthma Sister    ??? Asthma Sister         Allergies   Allergen Reactions   ??? Lortab [Hydrocodone-Acetaminophen] Unknown (comments)   ??? Penicillins Unknown (comments)   ??? Seafood [Shellfish Containing Products] Hives   ??? Tramadol Unknown (comments)     Prior to Admission medications    Medication Sig Start Date End Date Taking? Authorizing Provider   predniSONE [facility](STERAPRED DS) 10 mg dose pack As directed 10/15/12   O'Bier, April N, MD   famotidine (PEPCID) 20 mg tablet Take 1 Tab by mouth two (2) times a day. 10/15/12   O'Bier, April N, MD   ondansetron hcl (ZOFRAN) 4 mg tablet Take 1 Tab by mouth every eight (8)  hours as needed for Nausea. 08/19/12   Shari HeritageStrickland, Susangeline S, MD   ibuprofen (MOTRIN) 600 mg tablet Take 1 Tab by mouth every six (6) hours as needed for Pain. 08/19/12   Shari HeritageStrickland, Susangeline S, MD   ferrous sulfate 325 mg (65 mg iron) tablet Take 1 Tab by mouth two (2) times a day. 11/30/10   Hollings, Curt BearsJennifer J, MD        Review of Systems  A comprehensive review of systems was negative except for that written in the History of Present Illness.    Objective:     Vitals:    10/11/18 0030 10/11/18 0100 10/11/18 0130 10/11/18 0200   BP: 147/68 (!) 134/108 139/78 129/60   Pulse:       Resp:       Temp:       SpO2: 95% 100% 100% 100%       Physical Exam:  CV: RRR  Resp: CTAB  Abdomen: Obese, soft, non-tender, no rebound or guarding  Pelvis: No vaginal bleeding  Ext: No calf tenderness bilaterally    Assessment/Plan:     Nausea and vomiting in pregnancy    1) Admit for observation  2) IV fluids  3) Promethazine, pepcid, clear diet  4) No electrolyte abnormalities in CBC done in the ER  5) Urine culture pending  6) UDS pending        Signed By:  Weyman CroonKara A Seriah Brotzman, MD     Oct 11, 2018

## 2018-10-11 NOTE — ED Notes (Signed)
 TRANSFER - OUT REPORT:    Verbal report given to RN(name) on Wendy Bell  being transferred to anti partum(unit) for routine progression of care       Report consisted of patient's Situation, Background, Assessment and   Recommendations(SBAR).     Information from the following report(s) ED Summary was reviewed with the receiving nurse.    Lines:   Peripheral IV 10/11/18 Left Antecubital (Active)   Phlebitis Assessment 0 10/11/2018 12:31 AM   Dressing Status Clean, dry, & intact 10/11/2018 12:31 AM   Dressing Type Transparent 10/11/2018 12:31 AM   Hub Color/Line Status Pink 10/11/2018 12:31 AM        Opportunity for questions and clarification was provided.      Patient transported with:   Registered Nurse

## 2018-10-11 NOTE — H&P (Signed)
Antepartum History and Physical    Name: Wendy Bell MRN: [id number] SSN: [id number]    Date of Birth: 09-Apr-1991  Age: 28 y.o.  Sex: female       Subjective:      Chief complaint:  Nausea and vomiting in pregnancy    Wendy Bell is a 28 y.o.  G5P6 who is 6 weeks 4 days pregnant dated by an ultrasound done on 10/07/2018. She presents to the ER for nausea and vomiting which she reports began when she found out she was pregnant. She says today the vomiting started at 1pm and has been relentless. This is her 4th visit to an ER for treatment. She was seen at this ER before as well as [facility] "a few days ago" where she said she was diagnosed with a UTI as well as a miscarriage. She did not fill the Rx given because she did not believe them. She reports she was given Zofran which she did fill and has been taking it, though the symptoms have not stopped. She reports she has had to use marijuana 2x daily just to be able to eat anything. Initially, when I entered the room she was resting comfortably and quietly. As the interview progressed she started asking if she was going to be given pain medication. She was informed not prior to the evaluation. She then progressed to wailing and crying from what she described as stabbing pelvic pain. Interview was paused, abdomen was examined and was soft without rebound or guarding. Her reaction appeared out of proportion to the exam. Ultrasound from 10/07/2018 was reviewed and documented one IUP with a subchorionic hemorrhage noted, but no adnexal masses or free fluid. Beta-hcg on 10/07/2018 was 15,215, on 10/11/2018 it was 27,587. She reports receiving her prenatal care with [facility].    Patient denies vaginal bleeding or increased vaginal discharge or dysuria. Denies HA, visual changes, SOB or recent illness.  Patient reports nausea and vomiting (has vomited bile over the side of the bed) and "stabbing abdominal pain.    PMH: Patient reports she has been diagnosed  with intracranial hypertension which was diagnosed in 2015 after the birth of her twins. She says she is to take medication for it but doesn't. She denies any other PMH, though ADHD and asthma are documented in the EMR.    PSH: C/S x 1 for her twins      OB History     Gravida   1    Para        Term        Preterm        AB        Living           SAB        TAB        Ectopic        Molar        Multiple        Live Births                  Past Medical History:   Diagnosis Date   ??? ADHD (attention deficit hyperactivity disorder) 2010    not currently taking medication   ??? Asthma      History reviewed. No pertinent surgical history.  Social History     Occupational History   ??? Not on file   Tobacco Use   ??? Smoking status: Current Some Day Smoker   ???  Smokeless tobacco: Never Used   Substance and Sexual Activity   ??? Alcohol use: No   ??? Drug use: No   ??? Sexual activity: Yes     Partners: Male     Family History   Problem Relation Age of Onset   ??? Sickle Cell Anemia Mother    ??? Asthma Sister    ??? Asthma Brother         bronnchitis   ??? Hypertension Maternal Grandmother    ??? Breast Cancer Maternal Grandmother         breast ca   ??? Asthma Maternal Grandmother    ??? Hypertension Maternal Grandfather    ??? Kidney Disease Maternal Grandfather    ??? Asthma Sister    ??? Asthma Sister         Allergies   Allergen Reactions   ??? Lortab [Hydrocodone-Acetaminophen] Unknown (comments)   ??? Penicillins Unknown (comments)   ??? Seafood [Shellfish Containing Products] Hives   ??? Tramadol Unknown (comments)     Prior to Admission medications    Medication Sig Start Date End Date Taking? Authorizing Provider   predniSONE [facility] DS) 10 mg dose pack As directed 10/15/12   O'Bier, April N, MD   famotidine (PEPCID) 20 mg tablet Take 1 Tab by mouth two (2) times a day. 10/15/12   O'Bier, April N, MD   ondansetron hcl (ZOFRAN) 4 mg tablet Take 1 Tab by mouth every eight (8) hours as needed for Nausea. 08/19/12   Shari Heritage, MD    ibuprofen (MOTRIN) 600 mg tablet Take 1 Tab by mouth every six (6) hours as needed for Pain. 08/19/12   Shari Heritage, MD   ferrous sulfate 325 mg (65 mg iron) tablet Take 1 Tab by mouth two (2) times a day. 11/30/10   Hollings, Curt Bears, MD        Review of Systems  A comprehensive review of systems was negative except for that written in the History of Present Illness.    Objective:     Vitals:    10/11/18 0030 10/11/18 0100 10/11/18 0130 10/11/18 0200   BP: 147/68 (!) 134/108 139/78 129/60   Pulse:       Resp:       Temp:       SpO2: 95% 100% 100% 100%       Physical Exam:  CV: RRR  Resp: CTAB  Abdomen: Obese, soft, non-tender, no rebound or guarding  Pelvis: No vaginal bleeding  Ext: No calf tenderness bilaterally    Assessment/Plan:     Nausea and vomiting in pregnancy    1) Admit for observation  2) IV fluids  3) Promethazine, pepcid, clear diet  4) No electrolyte abnormalities in CBC done in the ER  5) Urine culture pending  6) UDS pending        Signed By:  Weyman Croon, MD     Oct 11, 2018

## 2018-10-11 NOTE — Progress Notes (Signed)
 TRANSFER - IN REPORT:    Verbal report received from Deanna, RN(name) on Wendy Bell  being received from ED(unit) for routine progression of care      Report consisted of patient's Situation, Background, Assessment and   Recommendations(SBAR).     Information from the following report(s) SBAR was reviewed with the receiving nurse.    Opportunity for questions and clarification was provided.      Assessment completed upon patient's arrival to unit and care assumed.     9349:  Patient arrived on unit.  VS & initial assessment performed.  9346:  Pt vomited 300 cc clear fluid  0712:  Spoke with Dr. Ena for IVF order, MD ordere LR @ 158ml/hr.

## 2018-10-11 NOTE — ED Notes (Signed)
Bedside and Verbal shift change report given to Lafonda Mosses RN (oncoming nurse) by Zorita Pang RN (offgoing nurse). Report included the following information SBAR and ED Summary.

## 2018-10-11 NOTE — Progress Notes (Signed)
Pt wanted to know if Dr Trisha Mangle is coming to see her, Dr Trisha Mangle called all calls will be connected to hospitalist Dr Norton Pastel called, updated about HR and total vomitus today= 200 cc and Phenergan med with opoid sedation scale of 3.

## 2018-10-11 NOTE — Progress Notes (Signed)
Bedside and Verbal shift change report given to A. Kirton RN (oncoming nurse) by Judie Petit. Personal assistant Physiological scientist). Report included the following information SBAR, Kardex, Intake/Output, MAR and Recent Results.

## 2018-10-12 LAB — CULTURE, URINE
Colonies Counted: >100000
Colony Count: >100000

## 2018-10-12 MED ORDER — PROMETHAZINE 12.5 MG RECTAL SUPPOSITORY
12.5 mg | Freq: Four times a day (QID) | RECTAL | 0 refills | Status: AC | PRN
Start: 2018-10-12 — End: 2018-10-19

## 2018-10-12 MED ORDER — PROMETHAZINE 12.5 MG RECTAL SUPPOSITORY
12.5 mg | Freq: Four times a day (QID) | RECTAL | Status: DC | PRN
Start: 2018-10-12 — End: 2018-10-12

## 2018-10-12 MED ORDER — ONDANSETRON 4 MG TAB, RAPID DISSOLVE
4 mg | Freq: Three times a day (TID) | ORAL | Status: DC | PRN
Start: 2018-10-12 — End: 2018-10-12

## 2018-10-12 MED ORDER — ONDANSETRON 4 MG TAB, RAPID DISSOLVE
4 mg | ORAL_TABLET | Freq: Three times a day (TID) | ORAL | 0 refills | Status: AC | PRN
Start: 2018-10-12 — End: ?

## 2018-10-12 MED FILL — NORMAL SALINE FLUSH 0.9 % INJECTION SYRINGE: INTRAMUSCULAR | Qty: 10

## 2018-10-12 MED FILL — FAMOTIDINE (PF) 20 MG/2 ML IV: 20 mg/2 mL | INTRAVENOUS | Qty: 2

## 2018-10-12 MED FILL — ONDANSETRON (PF) 4 MG/2 ML INJECTION: 4 mg/2 mL | INTRAMUSCULAR | Qty: 2

## 2018-10-12 NOTE — Progress Notes (Addendum)
0755 Verbal shift change report given to Paula Clay, RN (oncoming nurse) by Kelly Griffith, RN (offgoing nurse). Report included the following information SBAR.     Pt was resting comfortably in bed during shift change.    0804 Pt called out needing assistance to restroom. When RN arrived at room, pt was already in bathroom and ambulating well without assistance. Pt made small grimaces as if she were in pain, but once ambulating back to bed, grimaces stopped.    1258 Discharge instructions reviewed with patient. No questions at this time.    1306 Pt states she has a history of hidradenitis suppurativa and MRSA. MRSA swab done.

## 2018-10-12 NOTE — Discharge Summary (Signed)
Antepartum  Discharge Summary     Patient ID:  Wendy Bell  [id number]  27 y.o.  12/18/1990    Admit date: 10/10/2018    Admitting Diagnosis: Nausea and vomiting in pregnancy prior to [redacted] weeks gestation [O21.9]    Admission Diagnoses:  Active Problems:    Nausea and vomiting in pregnancy prior to [redacted] weeks gestation (10/11/2018)        Discharge date and time: 10/12/2018      Procedures for this admission:  * No surgery found [facility] Course:  No results found for this or any previous visit (from the past 24 hour(s)).  No results found for: PUQ, PROTU2, PROTU1, BJP1, CPE1, IMEL1, MET2     Condition on Discharge: improved    Disposition: Home or self care Home      Patient Instructions:     Current Discharge Medication List      CONTINUE these medications which have NOT CHANGED    Details   famotidine (PEPCID) 20 mg tablet Take 1 Tab by mouth two (2) times a day.  Qty: 20 Tab, Refills: 0      ondansetron hcl (ZOFRAN) 4 mg tablet Take 1 Tab by mouth every eight (8) hours as needed for Nausea.  Qty: 20 Tab, Refills: 0      ferrous sulfate 325 mg (65 mg iron) tablet Take 1 Tab by mouth two (2) times a day.  Qty: 60 Tab, Refills: 12         STOP taking these medications       predniSONE (STERAPRED DS) 10 mg dose pack Comments:   Reason for Stopping:         ibuprofen (MOTRIN) 600 mg tablet Comments:   Reason for Stopping:               Activity: Activity as tolerated  Diet:  BRAT/Bland as tolerated( bananas/rice/Crackers) advance as tolerated  Wound Care: None needed    Follow-up Information     Follow up With Specialties Details Why Contact Info    Alger Memos, NP Nurse Practitioner   [address]  [place] [address]  [phone]            Follow-up with   Dr Trisha Mangle of [facility] ASAP- Prior to scheduled First Visit. As New Problem Visit.  Follow-up Appointments   Procedures   ??? FOLLOW UP VISIT Appointment in: 3 - 5 Days Follow up ASAP with Primary OB prior to First Prenatal scheduled visit      Follow up ASAP with Primary OB prior to First Prenatal scheduled visit     Standing Status:   Standing     Number of Occurrences:   1     Order Specific Question:   Appointment in     Answer:   3 - 5 Days        Signed:  Nelta Numbers, MD  10/12/2018  12:09 PM

## 2018-10-12 NOTE — Discharge Summary (Signed)
Antepartum  Discharge Summary     Patient ID:  Wendy Bell  [id number]  27 y.o.  04-10-1991    Admit date: 10/10/2018    Admitting Diagnosis: Nausea and vomiting in pregnancy prior to [redacted] weeks gestation [O21.9]    Admission Diagnoses:  Active Problems:    Nausea and vomiting in pregnancy prior to [redacted] weeks gestation (10/11/2018)        Discharge date and time: 10/12/2018      Procedures for this admission:  * No surgery found [facility] Course:  No results found for this or any previous visit (from the past 24 hour(s)).  No results found for: PUQ, PROTU2, PROTU1, BJP1, CPE1, IMEL1, MET2     Condition on Discharge: improved    Disposition: Home or self care Home      Patient Instructions:     Current Discharge Medication List      CONTINUE these medications which have NOT CHANGED    Details   famotidine (PEPCID) 20 mg tablet Take 1 Tab by mouth two (2) times a day.  Qty: 20 Tab, Refills: 0      ondansetron hcl (ZOFRAN) 4 mg tablet Take 1 Tab by mouth every eight (8) hours as needed for Nausea.  Qty: 20 Tab, Refills: 0      ferrous sulfate 325 mg (65 mg iron) tablet Take 1 Tab by mouth two (2) times a day.  Qty: 60 Tab, Refills: 12         STOP taking these medications       predniSONE (STERAPRED DS) 10 mg dose pack Comments:   Reason for Stopping:         ibuprofen (MOTRIN) 600 mg tablet Comments:   Reason for Stopping:               Activity: Activity as tolerated  Diet:  BRAT/Bland as tolerated( bananas/rice/Crackers) advance as tolerated  Wound Care: None needed    Follow-up Information     Follow up With Specialties Details Why Contact Info    Alger Memos, NP Nurse Practitioner   [address]  [place] [address]  [phone]            Follow-up with   Dr Trisha Mangle of [facility] ASAP- Prior to scheduled First Visit. As New Problem Visit.  Follow-up Appointments   Procedures   ??? FOLLOW UP VISIT Appointment in: 3 - 5 Days Follow up ASAP with Primary OB prior to First Prenatal scheduled visit     Follow up ASAP  with Primary OB prior to First Prenatal scheduled visit     Standing Status:   Standing     Number of Occurrences:   1     Order Specific Question:   Appointment in     Answer:   3 - 5 Days        Signed:  Nelta Numbers, MD  10/12/2018  12:09 PM

## 2018-10-12 NOTE — Progress Notes (Signed)
0964 Verbal shift change report given to Bea Graff, RN (oncoming nurse) by Esaw Grandchild, RN (offgoing nurse). Report included the following information SBAR.     Pt was resting comfortably in bed during shift change.    0804 Pt called out needing assistance to restroom. When RN arrived at room, pt was already in bathroom and ambulating well without assistance. Pt made small grimaces as if she were in pain, but once ambulating back to bed, grimaces stopped.    1258 Discharge instructions reviewed with patient. No questions at this time.    1306 Pt states she has a history of hidradenitis suppurativa and MRSA. MRSA swab done.

## 2018-10-13 LAB — CULTURE, MRSA

## 2018-10-30 ENCOUNTER — Ambulatory Visit: Attending: Obstetrics & Gynecology | Primary: Family

## 2018-10-30 ENCOUNTER — Encounter

## 2018-10-30 ENCOUNTER — Ambulatory Visit: Admit: 2018-10-30 | Discharge: 2018-10-30 | Attending: Obstetrics & Gynecology | Primary: Family

## 2018-10-30 ENCOUNTER — Encounter: Attending: Obstetrics & Gynecology | Primary: Family

## 2018-10-30 ENCOUNTER — Encounter: Primary: Family

## 2018-10-30 ENCOUNTER — Encounter: Admit: 2018-10-30 | Primary: Family

## 2018-10-30 ENCOUNTER — Encounter: Admit: 2018-10-30 | Discharge: 2018-10-30 | Payer: PRIVATE HEALTH INSURANCE | Primary: Family

## 2018-10-30 DIAGNOSIS — Z3480 Encounter for supervision of other normal pregnancy, unspecified trimester: Secondary | ICD-10-CM

## 2018-10-30 DIAGNOSIS — O3680X0 Pregnancy with inconclusive fetal viability, not applicable or unspecified: Secondary | ICD-10-CM

## 2018-10-30 LAB — VARICELLA ZOSTER AB, EXTERNAL
VARICELLA, EXTERNAL: IMMUNE
Varicella Zoster Ab, External: IMMUNE

## 2018-10-30 LAB — HEMATOCRIT, EXTERNAL
HCT, EXTERNAL: 39.3 NA
Hct, External: 39.3

## 2018-10-30 LAB — HEMOGLOBIN FRACTIONATION, EXTERNAL
HGB EVAL, EXTERNAL: NEGATIVE
Hemoglobin eval., External: NEGATIVE

## 2018-10-30 LAB — HEPATITIS B SURFACE AG, EXTERNAL
HBSAG, EXTERNAL: NEGATIVE
HBsAg, External: NEGATIVE

## 2018-10-30 LAB — PLATELET, EXTERNAL
PLATELET CNT,   EXTERNAL: 198 NA
Platelet cnt., External: 198

## 2018-10-30 LAB — HEMOGLOBIN, EXTERNAL
HGB, EXTERNAL: 13.7 NA
Hgb, External: 13.7

## 2018-10-30 LAB — T PALLIDUM AB, EXTERNAL
T. PALLIDUM, EXTERNAL: NONREACTIVE
T. Pallidum Antibody, External: NONREACTIVE

## 2018-10-30 LAB — RUBELLA AB, IGG, EXTERNAL
RUBELLA, EXTERNAL: IMMUNE
Rubella, External: IMMUNE

## 2018-10-30 LAB — HIV-1 AB, EXTERNAL
HIV, EXTERNAL: NONREACTIVE
HIV, External: NONREACTIVE

## 2018-10-30 LAB — TYPE, ABO & RH, EXTERNAL
ABO,Rh: O POS
TYPE, ABO & RH, EXTERNAL: O POS

## 2018-10-30 LAB — ANTIBODY SCREEN, EXTERNAL
ANTIBODY SCREEN, EXTERNAL: NEGATIVE
Antibody screen, External: NEGATIVE

## 2018-10-30 NOTE — Progress Notes (Signed)
Initial OB Visit    Wendy Bell is a 28 y.o. G2 P0000 presenting for initial OB visit.   She is well without complaints today.   Nausea/vomiting but better with Zofran.  She denies pelvic pain and vaginal bleeding.  Her past medical history is significant for intercranial hypertension.    Per [place] today-TA ULTRASOUND PERFORMED  A SINGLE VIABLE [redacted]W[redacted]D WITH EDD OF 05/31/2019 IUP IS SEEN WITH NORMAL CARDIAC RHYTHM. THERE  APPEARS TO BE A HYPOECHOIC AREA ADJACENT TO THE GS MEASURING 13 X , POSSIBLE SCH.  GESTATIONAL AGE BASED ON TODAYS EXAM.  A NORMAL YOLK SAC IS SEEN.  BILATERAL OVARIES ARE NOT VISUALIZED DUE TO BOWEL GAS. RIGHT AND LEFT ADNEXAS APPEAR  WNL.  NO FREE FLUID SEEN IN THE CDS.    This is her 5th pregnancy. Her past pregnancies have been complicated with  intercranial hypertension.  This pregnancy has been without complications. She denies a history of domestic violence. She has answered our office questionnaire. She and her partners substance history, exposure history, genetic history, and infectious history are unremarkable.    Hx of C/s of twin B, then successful VBAC  Hx of intracranial hypertension s/p "occular surgery," currently followed by Neuro.    Ob/Gyn Hx:  G2 P0000  Patient's last menstrual period was 08/18/2018.  History of STIs: Denies  SA: Yes, one partner    Health maintenance:  Pap-    OB History     Gravida   2    Para        Term        Preterm        AB        Living           SAB        TAB        Ectopic        Molar        Multiple        Live Births                  Past Medical History:   Diagnosis Date   ??? ADHD (attention deficit hyperactivity disorder) 2010    not currently taking medication   ??? Asthma      No past surgical history on file.    Family History   Problem Relation Age of Onset   ??? Sickle Cell Anemia Mother    ??? Asthma Sister    ??? Asthma Brother         bronnchitis   ??? Hypertension Maternal Grandmother    ??? Breast Cancer Maternal Grandmother         breast ca    ??? Asthma Maternal Grandmother    ??? Hypertension Maternal Grandfather    ??? Kidney Disease Maternal Grandfather    ??? Asthma Sister    ??? Asthma Sister      Social History     Socioeconomic History   ??? Marital status: SINGLE     Spouse name: Not on file   ??? Number of children: Not on file   ??? Years of education: Not on file   ??? Highest education level: Not on file   Occupational History   ??? Not on file   Social Needs   ??? Financial resource strain: Not on file   ??? Food insecurity     Worry: Not on file     Inability: Not on file   ??? Transportation needs  Medical: Not on file     Non-medical: Not on file   Tobacco Use   ??? Smoking status: Current Some Day Smoker   ??? Smokeless tobacco: Never Used   Substance and Sexual Activity   ??? Alcohol use: No   ??? Drug use: No   ??? Sexual activity: Yes     Partners: Male   Lifestyle   ??? Physical activity     Days per week: Not on file     Minutes per session: Not on file   ??? Stress: Not on file   Relationships   ??? Social [employer]     Talks on phone: Not on file     Gets together: Not on file     Attends religious service: Not on file     Active member of club or organization: Not on file     Attends meetings of clubs or organizations: Not on file     Relationship status: Not on file   ??? Intimate partner violence     Fear of current or ex partner: Not on file     Emotionally abused: Not on file     Physically abused: Not on file     Forced sexual activity: Not on file   Other Topics Concern   ??? Not on file   Social History Narrative   ??? Not on file       Current Outpatient Medications   Medication Sig Dispense Refill   ??? ondansetron (ZOFRAN ODT) 4 mg disintegrating tablet Take 1 Tab by mouth every eight (8) hours as needed for Nausea or Vomiting. 20 Tab 0   ??? famotidine (PEPCID) 20 mg tablet Take 1 Tab by mouth two (2) times a day. 20 Tab 0   ??? ondansetron hcl (ZOFRAN) 4 mg tablet Take 1 Tab by mouth every eight (8) hours as needed for Nausea. 20 Tab 0    ??? ferrous sulfate 325 mg (65 mg iron) tablet Take 1 Tab by mouth two (2) times a day. 60 Tab 12     Allergies   Allergen Reactions   ??? Lortab [Hydrocodone-Acetaminophen] Unknown (comments)   ??? Penicillins Unknown (comments)   ??? Seafood [Shellfish Containing Products] Hives   ??? Tramadol Unknown (comments)       Review of Systems - History obtained from the patient  Constitutional: negative for weight loss, fever, night sweats  HEENT: negative for hearing loss, earache, congestion, snoring, sorethroat  CV: negative for chest pain, palpitations, edema  Resp: negative for cough, shortness of breath, wheezing  GI: negative for change in bowel habits, abdominal pain, black or bloody stools  GU: negative for frequency, dysuria, hematuria, vaginal discharge  MSK: negative for back pain, joint pain, muscle pain  Breast: negative for breast lumps, nipple discharge, galactorrhea  Skin :negative for itching, rash, hives  Neuro: negative for dizziness, headache, confusion, weakness  Psych: negative for anxiety, depression, change in mood  Heme/lymph: negative for bleeding, bruising, pallor    Physical Exam    Visit Vitals  LMP 08/18/2018       Constitutional  ?? Appearance: well-nourished, well developed, alert, in no acute distress    HENT  ?? Head and Face: appears normal    Skin  ?? General Inspection: no rash, no lesions identified    Neurologic/Psychiatric  ?? Mental Status:  ?? Orientation: grossly oriented to person, place and time  ?? Mood and Affect: mood normal, affect appropriate      Assessment/Plan:  28 y.o. G2 P0000 at Unknown by [facility] today. NO LMP.    Reviewed EDC and dating ultrasound.  IOB packet given and discussed including practice structure and collaborative care with MDs and Midwives. Reviewed call coverage including hospitalists. Discussed expected prenatal care and visits including IOB labs, anatomy scan, GTT, TDAP, Rhogam if indicated, third trimester labs and GBS. Dicussed emergency contact info.     Discussed other high yield topics including appropriate exercise, diet, nutrition, and expected weight gain in pregnancy. Handouts given. Discussed sex in pregnancy, avoiding cat litter, toxin/alcohol avoidance, traveling in pregnancy including zika travel warnings.    She and partner have not recently and do not plan on traveling to [place] areas during this pregnancy.    Discussed genetic screening options (QUAD/NIPT). She does not  desire testing.  Discussed carrier screening per ACOG guidelines. She does not  desire carrier screening.    We discussed COVID precautions and limiting office visits to limit her possible exposure at this time. She was encouraged to use mychart, virtual visits and phone calls until further notice, she voices understanding.     New OB labs today.  Continue daily PNV.     PMH:  Obesity, discussed anatomy with MFM and consult for ICH  Ref to Neurology.  Desires repeat VBAC    RTC: 1 month, or sooner prn for problems or concerns.  Cramping, pain, bleeding precautions reviewed.  Will contact with abnormal results.  Handouts and instructions provided.    Saddie Benders  10/30/2018  3:14 PM    Trisha Mangle  [id number] (2 Vag deliveries, then twins one vag the other C/S, then VBAC)  EDC by  Pertinents:  1. Intracranial HTN, neuro ref placed. She is currently seeing neurologist at [facility].  2. Desires another VBAC  3. Obesity will need to do anatomy with MFM    IOB labs:  Genetic Screening: declines  Anatomy:  GTT:  Flu:  TDAP:  Rhogam:  Third Tri Labs:  GBS:  COVID:    Pain mgmt. in labor:  Feeding:  Circ:  Social:    Signed By: Janne Lab, MD     Oct 30, 2018

## 2018-10-30 NOTE — Progress Notes (Signed)
Results abstracted and SC updated.

## 2018-10-30 NOTE — Patient Instructions (Signed)
Weeks 6 to 10 of Your Pregnancy: Care Instructions  Your Care Instructions    Congratulations on your pregnancy. This is an exciting and important time for you.  During the first 6 to 10 weeks of your pregnancy, your body goes through many changes. Your baby grows very fast, even though you cannot feel it yet. You may start to notice that you feel different, both in your body and your emotions. Because each woman's pregnancy is unique, there is no right way to feel. You may feel the healthiest you have ever been, or you may feel tired or sick to your stomach ("morning sickness").  These early weeks are a time to make healthy choices and to eat the best foods for you and your baby. This care sheet will give you some ideas.  This is also a good time to think about birth defects testing. These are tests done during pregnancy to look for possible problems with the baby. First trimester tests for birth defects can be done between 10 and 13 weeks of pregnancy, depending on the test. Talk with your doctor about what kinds of tests are available.  Follow-up care is a key part of your treatment and safety. Be sure to make and go to all appointments, and call your doctor if you are having problems. It's also a good idea to know your test results and keep a list of the medicines you take.  How can you care for yourself at home?  Eat well  ?? Eat at least 3 meals and 2 healthy snacks every day. Eat fresh, whole foods, including:  ? 7 or more servings of bread, tortillas, cereal, rice, pasta, or oatmeal.  ? 3 or more servings of vegetables, especially leafy green vegetables.  ? 2 or more servings of fruits.  ? 3 or more servings of milk, yogurt, or cheese.  ? 2 or more servings of meat, turkey, chicken, fish, eggs, or dried beans.  ?? Drink plenty of fluids, especially water. Avoid sodas and other sweetened drinks.  ?? Choose foods that have important vitamins for your baby, such as calcium, iron, and folate.   ? Dairy products, tofu, canned fish with bones, almonds, broccoli, dark leafy greens, corn tortillas, and fortified orange juice are good sources of calcium.  ? Beef, poultry, liver, spinach, lentils, dried beans, fortified cereals, and dried fruits are rich in iron.  ? Dark leafy greens, broccoli, asparagus, liver, fortified cereals, orange juice, peanuts, and almonds are good sources of folate.  ?? Avoid foods that could harm your baby.  ? Do not eat raw or undercooked meat, chicken, or fish (such as sushi or raw oysters).  ? Do not eat raw eggs or foods that contain raw eggs, such as Caesar dressing.  ? Do not eat soft cheeses and unpasteurized dairy foods, such as Brie, feta, or blue cheese.  ? Do not eat fish that contains a lot of mercury, such as shark, swordfish, tilefish, or king mackerel. Do not eat more than 6 ounces of tuna each week.  ? Do not eat raw sprouts, especially alfalfa sprouts.  ? Cut down on caffeine, such as coffee, tea, and cola.  Protect yourself and your baby  ?? Do not touch kitty litter or cat feces. They can cause an infection that could harm your baby.  ?? High body temperature can be harmful to your baby. So if you want to use a sauna or hot tub, be sure to talk to your doctor   about how to use it safely.  Cope with morning sickness  ?? Sip small amounts of water, juices, or shakes. Try drinking between meals, not with meals.  ?? Eat 5 or 6 small meals a day. Try dry toast or crackers when you first get up, and eat breakfast a little later.  ?? Avoid spicy, greasy, and fatty foods.  ?? When you feel sick, open your windows or go for a short walk to get fresh air.  ?? Try nausea wristbands. These help some women.  ?? Tell your doctor if you think your prenatal vitamins make you sick.  Where can you learn more?  Go to http://www.healthwise.net/[employer]Help[employer]ctions  Enter G112 in the search box to learn more about "Weeks 6 to 10 of Your Pregnancy: Care Instructions."   Current as of: May 29, 2019Content Version: 12.4  ?? 2006-2020 [employer].  Care instructions adapted under license by [employer] (which disclaims liability or warranty for this information). If you have questions about a medical condition or this instruction, always ask your healthcare professional. [employer]wise, Inc[employer] disclaims any warranty or liability for your use of this information.

## 2018-10-30 NOTE — Progress Notes (Signed)
Initial OB Visit    Wendy Bell is a 28 y.o. G2 P0000 presenting for initial OB visit.   She is well without complaints today.   Nausea/vomiting but better with Zofran.  She denies pelvic pain and vaginal bleeding.  Her past medical history is significant for intercranial hypertension.    Per US today-TA ULTRASOUND PERFORMED  A SINGLE VIABLE [redacted]W[redacted]D WITH EDD OF 05/31/2019 IUP IS SEEN WITH NORMAL CARDIAC RHYTHM. THERE  APPEARS TO BE A HYPOECHOIC AREA ADJACENT TO THE GS MEASURING 13 X 6MM, POSSIBLE SCH.  GESTATIONAL AGE BASED ON TODAYS EXAM.  A NORMAL YOLK SAC IS SEEN.  BILATERAL OVARIES ARE NOT VISUALIZED DUE TO BOWEL GAS. RIGHT AND LEFT ADNEXAS APPEAR  WNL.  NO FREE FLUID SEEN IN THE CDS.    This is her 5th pregnancy. Her past pregnancies have been complicated with  intercranial hypertension.  This pregnancy has been without complications. She denies a history of domestic violence. She has answered our office questionnaire. She and her partners substance history, exposure history, genetic history, and infectious history are unremarkable.    Hx of C/s of twin B, then successful VBAC  Hx of intracranial hypertension s/p "occular surgery," currently followed by Neuro.    Ob/Gyn Hx:  G2 P0000  Patient's last menstrual period was 08/18/2018.  History of STIs: Denies  SA: Yes, one partner    Health maintenance:  Pap-    OB History     Gravida   2    Para        Term        Preterm        AB        Living           SAB        TAB        Ectopic        Molar        Multiple        Live Births                  Past Medical History:   Diagnosis Date   ??? ADHD (attention deficit hyperactivity disorder) 2010    not currently taking medication   ??? Asthma      No past surgical history on file.    Family History   Problem Relation Age of Onset   ??? Sickle Cell Anemia Mother    ??? Asthma Sister    ??? Asthma Brother         bronnchitis   ??? Hypertension Maternal Grandmother    ??? Breast Cancer Maternal Grandmother         breast ca   ???  Asthma Maternal Grandmother    ??? Hypertension Maternal Grandfather    ??? Kidney Disease Maternal Grandfather    ??? Asthma Sister    ??? Asthma Sister      Social History     Socioeconomic History   ??? Marital status: SINGLE     Spouse name: Not on file   ??? Number of children: Not on file   ??? Years of education: Not on file   ??? Highest education level: Not on file   Occupational History   ??? Not on file   Social Needs   ??? Financial resource strain: Not on file   ??? Food insecurity     Worry: Not on file     Inability: Not on file   ??? Transportation needs  Medical: Not on file     Non-medical: Not on file   Tobacco Use   ??? Smoking status: Current Some Day Smoker   ??? Smokeless tobacco: Never Used   Substance and Sexual Activity   ??? Alcohol use: No   ??? Drug use: No   ??? Sexual activity: Yes     Partners: Male   Lifestyle   ??? Physical activity     Days per week: Not on file     Minutes per session: Not on file   ??? Stress: Not on file   Relationships   ??? Social Wellsite geologist on phone: Not on file     Gets together: Not on file     Attends religious service: Not on file     Active member of club or organization: Not on file     Attends meetings of clubs or organizations: Not on file     Relationship status: Not on file   ??? Intimate partner violence     Fear of current or ex partner: Not on file     Emotionally abused: Not on file     Physically abused: Not on file     Forced sexual activity: Not on file   Other Topics Concern   ??? Not on file   Social History Narrative   ??? Not on file       Current Outpatient Medications   Medication Sig Dispense Refill   ??? ondansetron (ZOFRAN ODT) 4 mg disintegrating tablet Take 1 Tab by mouth every eight (8) hours as needed for Nausea or Vomiting. 20 Tab 0   ??? famotidine (PEPCID) 20 mg tablet Take 1 Tab by mouth two (2) times a day. 20 Tab 0   ??? ondansetron hcl (ZOFRAN) 4 mg tablet Take 1 Tab by mouth every eight (8) hours as needed for Nausea. 20 Tab 0   ??? ferrous sulfate 325 mg (65  mg iron) tablet Take 1 Tab by mouth two (2) times a day. 60 Tab 12     Allergies   Allergen Reactions   ??? Lortab [Hydrocodone-Acetaminophen] Unknown (comments)   ??? Penicillins Unknown (comments)   ??? Seafood [Shellfish Containing Products] Hives   ??? Tramadol Unknown (comments)       Review of Systems - History obtained from the patient  Constitutional: negative for weight loss, fever, night sweats  HEENT: negative for hearing loss, earache, congestion, snoring, sorethroat  CV: negative for chest pain, palpitations, edema  Resp: negative for cough, shortness of breath, wheezing  GI: negative for change in bowel habits, abdominal pain, black or bloody stools  GU: negative for frequency, dysuria, hematuria, vaginal discharge  MSK: negative for back pain, joint pain, muscle pain  Breast: negative for breast lumps, nipple discharge, galactorrhea  Skin :negative for itching, rash, hives  Neuro: negative for dizziness, headache, confusion, weakness  Psych: negative for anxiety, depression, change in mood  Heme/lymph: negative for bleeding, bruising, pallor    Physical Exam    Visit Vitals  LMP 08/18/2018       Constitutional  ?? Appearance: well-nourished, well developed, alert, in no acute distress    HENT  ?? Head and Face: appears normal    Skin  ?? General Inspection: no rash, no lesions identified    Neurologic/Psychiatric  ?? Mental Status:  ?? Orientation: grossly oriented to person, place and time  ?? Mood and Affect: mood normal, affect appropriate      Assessment/Plan:  28 y.o. G2 P0000 at Unknown by [facility] today. NO LMP.    Reviewed EDC and dating ultrasound.  IOB packet given and discussed including practice structure and collaborative care with MDs and Midwives. Reviewed call coverage including hospitalists. Discussed expected prenatal care and visits including IOB labs, anatomy scan, GTT, TDAP, Rhogam if indicated, third trimester labs and GBS. Dicussed emergency contact info.    Discussed other high yield topics  including appropriate exercise, diet, nutrition, and expected weight gain in pregnancy. Handouts given. Discussed sex in pregnancy, avoiding cat litter, toxin/alcohol avoidance, traveling in pregnancy including zika travel warnings.    She and partner have not recently and do not plan on traveling to [place] areas during this pregnancy.    Discussed genetic screening options (QUAD/NIPT). She does not  desire testing.  Discussed carrier screening per ACOG guidelines. She does not  desire carrier screening.    We discussed COVID precautions and limiting office visits to limit her possible exposure at this time. She was encouraged to use mychart, virtual visits and phone calls until further notice, she voices understanding.     New OB labs today.  Continue daily PNV.     PMH:  Obesity, discussed anatomy with MFM and consult for ICH  Ref to Neurology.  Desires repeat VBAC    RTC: 1 month, or sooner prn for problems or concerns.  Cramping, pain, bleeding precautions reviewed.  Will contact with abnormal results.  Handouts and instructions provided.    Saddie Benders  10/30/2018  3:14 PM    Trisha Mangle  [id number] (2 Vag deliveries, then twins one vag the other C/S, then VBAC)  EDC by  Pertinents:  1. Intracranial HTN, neuro ref placed. She is currently seeing neurologist at [facility].  2. Desires another VBAC  3. Obesity will need to do anatomy with MFM    IOB labs:  Genetic Screening: declines  Anatomy:  GTT:  Flu:  TDAP:  Rhogam:  Third Tri Labs:  GBS:  COVID:    Pain mgmt. in labor:  Feeding:  Circ:  Social:    Signed By: Janne Lab, MD     Oct 30, 2018

## 2018-10-31 ENCOUNTER — Encounter

## 2018-11-01 LAB — CBC W/O DIFF
HCT: 39.3 % (ref 34.0–46.6)
HGB: 13.7 g/dL (ref 11.1–15.9)
MCH: 29.8 pg (ref 26.6–33.0)
MCHC: 34.9 g/dL (ref 31.5–35.7)
MCV: 86 fL (ref 79–97)
PLATELET: 198 10*3/uL (ref 150–450)
RBC: 4.59 x10E6/uL (ref 3.77–5.28)
RDW: 13.1 % (ref 11.7–15.4)
WBC: 4.8 10*3/uL (ref 3.4–10.8)

## 2018-11-01 LAB — HEP B SURFACE AG: Hep B surface Ag screen: NEGATIVE

## 2018-11-01 LAB — TYPE & SCREEN
Antibody screen: NEGATIVE
Rh (D): POSITIVE

## 2018-11-01 LAB — T PALLIDUM SCREEN W/REFLEX
T PALLIDUM AB: NONREACTIVE
T PALLIDUM AB: NONREACTIVE

## 2018-11-01 LAB — HEMOGLOBIN FRACTIONATION
HEMOGLOBIN A2: 1.8 % (ref 1.8–3.2)
HEMOGLOBIN F: 0 % (ref 0.0–2.0)
HEMOGLOBIN OTHER: 0 %
HEMOGLOBIN S: 0 %
HGB A: 98.2 % (ref 96.4–98.8)
HGB SOLUBILITY: NEGATIVE
Hemoglobin A2: 1.8 % (ref 1.8–3.2)
Hemoglobin A: 98.2 % (ref 96.4–98.8)
Hemoglobin C: 0 %
Hemoglobin C: 0 %
Hemoglobin F: 0 % (ref 0.0–2.0)
Hemoglobin S: 0 %
Hemoglobin, Other: 0 %
Hgb Solubility: NEGATIVE

## 2018-11-01 LAB — RUBELLA AB, IGG
RUBELLA ANTIBODIES, IGG, 006201: 1.88 index (ref >0.99)
Rubella Ab, IgG: 1.88 index (ref >0.99)

## 2018-11-01 LAB — VZV AB, IGG
VARICELLA ZOSTER IGG, 096209: 761 index (ref >165)
VARICELLA ZOSTER IGG: 761 index (ref >165)

## 2018-11-01 LAB — HIV 1/2 AG/AB, 4TH GENERATION,W RFLX CONFIRM: HIV SCREEN 4TH GENERATION WRFX: NONREACTIVE

## 2018-11-01 LAB — CBC
Hematocrit: 39.3 % (ref 34.0–46.6)
Hemoglobin: 13.7 g/dL (ref 11.1–15.9)
MCH: 29.8 pg (ref 26.6–33.0)
MCHC: 34.9 g/dL (ref 31.5–35.7)
MCV: 86 fL (ref 79–97)
Platelets: 198 10*3/uL (ref 150–450)
RBC: 4.59 x10E6/uL (ref 3.77–5.28)
RDW: 13.1 % (ref 11.7–15.4)
WBC: 4.8 10*3/uL (ref 3.4–10.8)

## 2018-11-01 LAB — TYPE AND SCREEN
Antibody Screen: NEGATIVE
Rh Type: POSITIVE

## 2018-11-01 LAB — HIV 1/2 ANTIGEN/ANTIBODY, FOURTH GENERATION W/RFL: HIV Screen 4th Generation wRfx: NONREACTIVE

## 2018-11-01 LAB — HEPATITIS B SURFACE ANTIGEN: Hepatitis B Surface Ag: NEGATIVE

## 2018-12-12 ENCOUNTER — Encounter: Attending: Obstetrics & Gynecology | Primary: Family

## 2019-01-14 ENCOUNTER — Encounter: Primary: Family

## 2019-01-14 ENCOUNTER — Encounter: Attending: Obstetrics & Gynecology | Primary: Family

## 2024-05-19 NOTE — Progress Notes (Signed)
 "    Surgical Oncology Clinic      Date of Service: 05/19/2024   Patient Name: Wendy Bell  MRN: [id number]    Referring Physician  No referring provider defined for this encounter.    [facility] Team:  Surgical Oncology: Antonia Labor   Nurse practitioner: Rosina Poplar    Assessment:  Wendy Bell is a 33 y.o. female, obese, who presents with right breast developing abscess. We had a frank and thorough discussion of her diagnosis and the next steps regarding surgical management. Specifically we discussed the following plan:     Given the size and the complexity of the collection I discussed that she will need incision and drainage of that abscess.  Patient states she has been seen in the office since early this morning and is not prepared for the procedure today.  Also given the fact that this collection is complex, I do see some benefit in continuing with antibiotics and allowing for the fluid liquefaction of the necrotic tissue, to allow complete drainage at the time of the procedure.      Plan:  Continue antibiotics as prescribed.  Given penicillin allergy I would recommend continuing with the current antibiotics.  Zofran  prescribed to help with nausea.  Return to clinic on Friday for incision and drainage of the abscess  Follow-up right breast ultrasound on Friday before clinic to evaluate complexity of collection and recent status.    ------------------  I spent 65 minutes, of which more than 50% was spent in face-to-face consultation with the patient and patient-directed care coordination.   ------------------    Antonia Labor, MD  Surgical Oncology    History of Present Illness:  Wendy Bell is a 33 y.o. female who presents to the surgical oncology clinic for consultation for right breast mass and redness. She is accompanied in clinic alone.     At the time of this visit, She reports right breast pain and swelling that started roughly 1 week ago this has led to discomfort  such tenderness difficulty wearing a supportive bra and increased in size of the swelling. She most recent mammogram was on 05/15/2024 which showed round circumscribed high density mass roughly 5 cm with surrounding trabecular thickening in the right breast.  Right nipple is inverted her right breast is exquisitely tender.  Ultrasound of the right breast showed an ill-defined irregular complex cystic and solid mass in the central breast representing complex fluid collection roughly measuring 5 x 5 cm. Patient states she was started on Bactrim on Friday and since then the redness and pain have decreased significantly.  Previous treatment receive include antibiotics that was started on Friday    Review of Systems:  Review of Systems   Constitutional:  Negative for chills, fever and unexpected weight change.   HENT:  Negative for ear pain and trouble swallowing.    Respiratory:  Negative for cough and shortness of breath.    Cardiovascular:  Negative for chest pain and leg swelling.   Gastrointestinal:  Negative for abdominal pain.   Genitourinary:  Negative for hematuria.   Musculoskeletal:  Negative for arthralgias and back pain.   Skin:  Negative for rash.        She has had no breast lumps, bumps, redness, rashes, nipple discharge or nipple changes.  No breast pain.      Neurological:  Negative for headaches.   Hematological:  Negative for adenopathy.     Past Medical History:  Medical  History[1]    Past Surgical History:  Surgical History[2]    Family History:  Family History[3]    Social History:  Social History[4]    Medications:  Current Outpatient Medications   Medication Instructions    acetaZOLAMIDE (DIAMOX) 250 mg, Oral, 2 times daily    albuterol 108 (90 Base) MCG/ACT inhaler inhale 2 puffs by mouth and INTO THE LUNGS every 4 to 6 hours if needed for cough or wheezing    albuterol 108 (90 Base) MCG/ACT inhaler 2 puffs, Inhalation, Every 4 hours PRN    diclofenac (VOLTAREN) 4 g, Topical, 4 times daily  PRN    ondansetron  ODT (ZOFRAN -ODT) 4 mg, Oral, Every 8 hours PRN    sulfamethoxazole-trimethoprim (Bactrim DS) 800-160 MG tablet 1 tablet, Oral, 2 times daily    topiramate (TOPAMAX) 25 mg, Oral, 2 times daily       Allergies:  Allergies[5]    Physical Exam:  Visit Vitals  BP 115/82 (BP Location: Right arm, Patient Position: Sitting)   Pulse 84   Temp 36.9 C (98.5 F) (Oral)   Resp 18     Ht Readings from Last 1 Encounters:   05/13/24 1.626 m      Wt Readings from Last 1 Encounters:   05/19/24 128.9 kg    Body mass index is 48.77 kg/m.     Physical Exam  Vitals reviewed.   Constitutional:       General: She is not in acute distress.     Appearance: Normal appearance. She is not ill-appearing, toxic-appearing or diaphoretic.   HENT:      Head: Normocephalic and atraumatic.   Eyes:      Extraocular Movements: Extraocular movements intact.      Pupils: Pupils are equal, round, and reactive to light.   Cardiovascular:      Rate and Rhythm: Normal rate and regular rhythm.      Pulses: Normal pulses.   Pulmonary:      Effort: Pulmonary effort is normal. No accessory muscle usage.      Breath sounds: Normal breath sounds.   Abdominal:      Palpations: Abdomen is soft. There is no mass.   Musculoskeletal:         General: Normal range of motion.      Cervical back: Normal range of motion and neck supple.   Lymphadenopathy:      Cervical: No cervical adenopathy.   Skin:     General: Skin is warm and dry.      Comments: Breast exam   Right and left breast examined in supine and sitting position.   Right breast large diffuse swelling, with tenderness in upper inner breast.   No nipple areola complex changes  No nipple discharge    No enlarged axillary lymph nodes palpable   Neurological:      General: No focal deficit present.      Mental Status: She is alert and oriented to person, place, and time.   Psychiatric:         Mood and Affect: Mood normal.         Behavior: Behavior normal.        Imaging:  Mammogram  diagnostic bilateral  Narrative: STUDY: BI MAMMOGRAM DIAGNOSTIC BILATERAL, BI US  BREAST LIMITED RIGHT    INDICATION: 33 year old with right breast pain, clear nipple discharge, and a palpable concern. The patient reports her symptoms first began in October, at which time she presented to the emergency room  for evaluation.  Per review of EMR, examination at that time was not concerning for infection but was notable for right nipple inversion.  The patient was discharged with recommendation for outpatient follow up. The patient was seen by Surgical Oncology in the outpatient setting on 05/06/2024 for worsening right breast pain and new onset right breast mass.  There was no evidence for active infection at that time and follow up imaging was recommended and scheduled for 05/15/2024 (today).  In the interval, she presented to the emergency room on 05/12/2024 where sonographic evaluation was concerning for developing mastitis without organized fluid collection.  Outpatient follow up imaging was recommended and is performed today. The patient states she has not received antibiotic treatment.     COMPARISON:  Breast sonogram dated 03/07/2024 and 05/13/2024.SABRA    BREAST COMPOSITION: There are scattered areas of fibroglandular density.    TECHNIQUE: Bilateral craniocaudal and mediolateral oblique views with tomosynthesis were obtained. Computed aided detection (CAD) was utilized.  A targeted right breast ultrasound was also performed.     FINDINGS:  BREAST IMAGING CONSULTATION  No masses, malignant-type calcifications, or other suspicious abnormalities are identified in the left breast.    A round circumscribed equal to high density mass is present in the subareolar right breast measuring up to 5.0 cm. There is significant surrounding trabecular thickening surrounding mass. There are no malignant type calcifications. There are three axillary lymph nodes which demonstrate asymmetric cortical thickening.      On physical  examination, the right breast is diffusely edematous and the medial right breast is erythematous and exhibits skin thickening and induration.  The skin is dry without areas of skin break down. The right nipple is inverted. There is no spontaneous nipple discharge.  The right breast is exquisitely tender, therefore expression of nipple discharge was deferred.    Sonographic evaluation is limited by extreme tenderness to palpation.  Sonographically, the right breast is diffusely edematous. There is an ill-defined irregular complex cystic and solid mass in the central breast, which likely reflects a complex fluid collection.  This collection is immediately deep to the nipple areolar complex and extends to the 12:00 o'clock 4cm position superiorly and measures approximately 5.0 x 3.1 x 5.1 cm.    Sonographic evaluation of the right axilla demonstrates three morphologically abnormal lymph nodes with diffusely thickened cortices. The largest lymph node measures up to 2.1 cm.  Impression: Imaging and physical examination findings highly suggestive of a right breast abscess with reactive right axillary lymphadenopathy, though a right breast malignancy remains a possibility.  The patient's exquisite right breast pain markedly limits sonographic evaluation and precludes an ultrasound guided aspiration of the complex collection.  Therefore, surgical consultation is recommended and a same day appointment was facilitated. A short interval follow up ultrasound following clinical, and potentially surgical, management is recommended to confirm improvement.  The patient is scheduled for a follow up ultrasound on Monday 05/19/2024 at the breast center.     Results and recommendations were discussed with patient, and provided to her in writing, at the conclusion of imaging.     BI-RADS: 3. Probably Benign    RECOMMENDATION:  Right Surgical consultation.    Right Short interval follow up.    Dictated By: Nat Parlor MD    Electronically Verified by: Nat Parlor MD 05/15/2024 4:53 PM  US  breast limited right  Narrative: STUDY: BI MAMMOGRAM DIAGNOSTIC BILATERAL, BI US  BREAST LIMITED RIGHT    INDICATION: 33 year old with right breast pain,  clear nipple discharge, and a palpable concern. The patient reports her symptoms first began in October, at which time she presented to the emergency room for evaluation.  Per review of EMR, examination at that time was not concerning for infection but was notable for right nipple inversion.  The patient was discharged with recommendation for outpatient follow up. The patient was seen by Surgical Oncology in the outpatient setting on 05/06/2024 for worsening right breast pain and new onset right breast mass.  There was no evidence for active infection at that time and follow up imaging was recommended and scheduled for 05/15/2024 (today).  In the interval, she presented to the emergency room on 05/12/2024 where sonographic evaluation was concerning for developing mastitis without organized fluid collection.  Outpatient follow up imaging was recommended and is performed today. The patient states she has not received antibiotic treatment.     COMPARISON:  Breast sonogram dated 03/07/2024 and 05/13/2024.SABRA    BREAST COMPOSITION: There are scattered areas of fibroglandular density.    TECHNIQUE: Bilateral craniocaudal and mediolateral oblique views with tomosynthesis were obtained. Computed aided detection (CAD) was utilized.  A targeted right breast ultrasound was also performed.     FINDINGS:  BREAST IMAGING CONSULTATION  No masses, malignant-type calcifications, or other suspicious abnormalities are identified in the left breast.    A round circumscribed equal to high density mass is present in the subareolar right breast measuring up to 5.0 cm. There is significant surrounding trabecular thickening surrounding mass. There are no malignant type calcifications. There are three axillary lymph nodes  which demonstrate asymmetric cortical thickening.      On physical examination, the right breast is diffusely edematous and the medial right breast is erythematous and exhibits skin thickening and induration.  The skin is dry without areas of skin break down. The right nipple is inverted. There is no spontaneous nipple discharge.  The right breast is exquisitely tender, therefore expression of nipple discharge was deferred.    Sonographic evaluation is limited by extreme tenderness to palpation.  Sonographically, the right breast is diffusely edematous. There is an ill-defined irregular complex cystic and solid mass in the central breast, which likely reflects a complex fluid collection.  This collection is immediately deep to the nipple areolar complex and extends to the 12:00 o'clock 4cm position superiorly and measures approximately 5.0 x 3.1 x 5.1 cm.    Sonographic evaluation of the right axilla demonstrates three morphologically abnormal lymph nodes with diffusely thickened cortices. The largest lymph node measures up to 2.1 cm.  Impression: Imaging and physical examination findings highly suggestive of a right breast abscess with reactive right axillary lymphadenopathy, though a right breast malignancy remains a possibility.  The patient's exquisite right breast pain markedly limits sonographic evaluation and precludes an ultrasound guided aspiration of the complex collection.  Therefore, surgical consultation is recommended and a same day appointment was facilitated. A short interval follow up ultrasound following clinical, and potentially surgical, management is recommended to confirm improvement.  The patient is scheduled for a follow up ultrasound on Monday 05/19/2024 at the breast center.     Results and recommendations were discussed with patient, and provided to her in writing, at the conclusion of imaging.     BI-RADS: 3. Probably Benign    RECOMMENDATION:  Right Surgical consultation.    Right Short  interval follow up.    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    Strabismus    [2]  Past Surgical History:  Procedure Laterality Date    CT HEAD ANGIO W AND WO IV CONTRAST  10/22/2015    CT HEAD ANGIO W AND WO IV CONTRAST CONVERSION CERNER    LUMBAR PUNCTURE  10/04/2015    LUMBAR PUNCTURE  08/19/2013    OTHER SURGICAL HISTORY  05/27/2019    Cesarean Section (Laterality None) 05/27/2019 11:21 - Millikin RN, [place]  auto-populated from documented surgical case    OTHER SURGICAL HISTORY  10/03/2013    LTCS (Twin B)    OTHER SURGICAL HISTORY  10/03/2013    Vaginal delivery (Twin A)    OTHER SURGICAL HISTORY      Cranial Cyst removed as a child    OTHER SURGICAL HISTORY  01/14/2016    Decompression of optic nerve (II), right    TUBAL LIGATION  05/27/2019    06/03/2019 12:16 - Wical RN, Christina auto-populated from documented surgical case    VAGINAL DELIVERY  01/14/2011    Vaginal delivery only (with or without episiotomy and/or forceps);    VAGINAL DELIVERY  12/30/2011    SVD - Spontaneous vaginal delivery    VAGINAL DELIVERY  07/08/2015    Vaginal delivery, medical personnel present   [3]  Family History  Problem Relation Name Age of Onset    Cataracts Mother      Diabetes Maternal Grandmother      Breast cancer Maternal Grandmother      Diabetes Cousin      Breast cancer Other          Maternal great aunt   [4]  Social History  Tobacco Use    Smoking status: Former    Smokeless tobacco: Current    Tobacco comments:     Vape Nicotine   Vaping Use    Vaping status: Every Day    Substances: Electronic vaping    Devices: Disposable   Substance Use Topics    Alcohol use: Never    Drug use: Never   [5]  Allergies  Allergen Reactions    Shellfish Allergy  Anaphylaxis     Reaction Type: Allergy; Severity: Severe    Hydrocodone-Acetaminophen  Hives and Unknown     Reaction Type: Allergy      Penicillins Other and Unknown     Reaction Type: Allergy; Reaction(s): hives,rash      Tramadol Hives and Unknown     Reaction Type: Allergy     "

## 2024-05-21 NOTE — ED Triage Notes (Signed)
"  Pt BIB EMS for rt breast abscess for 3 months Pt schedule for drainage on Friday 05/23/2024  "

## 2024-05-21 NOTE — ED Notes (Signed)
 "[facility] NOTIFICATION 05/21/2024 19:11 Wendy Bell, Wendy Bell DOB: 12/04/1990 MRN: [id number]    [facility]'s patient encounter information:   [id number]  Account [id number]  Billing Account [id number]      Criteria Met    5+ ED Visits in 12 Months    Security and Safety  No Security Events were found.  ED Care Guidelines  There are currently no ED Care Guidelines for this patient. Please check your facility's medical records system.        Prescription Monitoring Program  000 - Narcotic Use Score   000 - Sedative Use Score   000 - Stimulant Use Score   000- Overdose Risk Score  - All Scores range from 000-999 with 75% of the population scoring < 200 and only 1% scoring above 650  - The last digit of the narcotic, sedative, and stimulant score indicates the number of active prescriptions of that type  - Higher Use scores correlate with increased prescribers, pharmacies, mg equiv, and overlapping prescriptions   - Higher Overdose Risk Scores correlate with increased risk of unintentional overdose death   Concerning or unexpectedly high scores should prompt a review of the PMP record; this does not constitute checking PMP for prescribing purposes.    E.D. Visit Count (12 mo.)  Facility Visits   [facility] 7   Total 7   Note: Visits indicate total known visits.     Recent Emergency Department Visit Summary  Date Facility [facility] Type Diagnoses or Chief Complaint    May 21, 2024  [facility]  [place]  Emergency    Abscess w/Pain      May 13, 2024  [facility]  [place]  Emergency    Mastodynia    Breast Pain      May 12, 2024  [facility]  [place]  Emergency    Breast Pain    lump in breast dr sent      Apr 23, 2024  [facility]  [place]  Emergency    Knee Pain    RIGHT KNEE PAIN      Mar 06, 2024  [facility]  [place]  Emergency    Mastodynia    Breast Problem    BREAST LEAKAGE, RIGHT SIDE      Feb 17, 2024  [facility]  [place]  Emergency    Unspecified asthma with (acute)  exacerbation    Shortness of Breath    Leg Swelling    SOB, ANKLES ARE SWOLLEN      Jun 22, 2023  [facility]  [place]  Emergency    Sprain of unspecified ligament of right ankle, initial encounter    Ankle Pain    FALL A WEEK AGO STILL HAVING PAIN        Recent Inpatient Visit Summary  No Recent Inpatient Visits were found.  Care Team  Provider Specialty Phone Fax Service Dates   [place], [place] B. Nurse Practitioner: Family [phone] Current      [facility]  This patient has registered at the [facility] Emergency Department  For more information visit: [email or web address]     PLEASE NOTE:     Any care recommendations and other clinical information are provided as guidelines or for historical purposes only, and providers should exercise their own clinical judgment when providing care.    You may only use this information for purposes of treatment, payment or health care operations activities, and subject to the limitations of applicable  [facility] Policies.    You should consult directly with the organization that provided a care guideline or other clinical history with any questions about additional information or accuracy or completeness of information provided.     2025 [facility] - [email or web address]     "

## 2024-05-26 ENCOUNTER — Emergency Department: Admit: 2024-05-26 | Payer: Medicaid (Managed Care) | Primary: Family

## 2024-05-26 ENCOUNTER — Inpatient Hospital Stay
Admit: 2024-05-26 | Discharge: 2024-05-27 | Disposition: A | Discharge status: Home / Self Care | Payer: Medicaid (Managed Care) | Arrived: AM | Attending: Student in an Organized Health Care Education/Training Program

## 2024-05-26 LAB — EXTRA TUBES HOLD

## 2024-05-26 LAB — CBC WITH AUTO DIFFERENTIAL
Basophils %: 0.3 % (ref 0.0–1.0)
Basophils Absolute: 0.03 K/UL (ref 0.00–0.10)
Eosinophils %: 1.7 % (ref 0.0–7.0)
Eosinophils Absolute: 0.15 K/UL (ref 0.00–0.40)
Hematocrit: 35.4 % (ref 35.0–47.0)
Hemoglobin: 11.5 g/dL (ref 11.5–16.0)
Immature Granulocytes %: 0.8 % — ABNORMAL HIGH (ref 0.0–0.5)
Immature Granulocytes Absolute: 0.07 K/UL — ABNORMAL HIGH (ref 0.00–0.04)
Lymphocytes %: 19.4 % (ref 12.0–49.0)
Lymphocytes Absolute: 1.75 K/UL (ref 0.80–3.50)
MCH: 28.5 pg (ref 26.0–34.0)
MCHC: 32.5 g/dL (ref 30.0–36.5)
MCV: 87.8 FL (ref 80.0–99.0)
Monocytes %: 8.2 % (ref 5.0–13.0)
Monocytes Absolute: 0.74 K/UL (ref 0.00–1.00)
Neutrophils %: 69.6 % (ref 32.0–75.0)
Neutrophils Absolute: 6.27 K/UL (ref 1.80–8.00)
Nucleated RBCs: 0 /100{WBCs}
Platelets: 263 K/uL (ref 150–400)
RBC: 4.03 M/uL (ref 3.80–5.20)
RDW: 13 % (ref 11.5–14.5)
WBC: 9 K/uL (ref 3.6–11.0)
nRBC: 0 K/uL (ref 0.00–0.01)

## 2024-05-26 LAB — COMPREHENSIVE METABOLIC PANEL
ALT: 109 U/L — ABNORMAL HIGH (ref 10–35)
AST: 89 U/L — ABNORMAL HIGH (ref 10–35)
Albumin/Globulin Ratio: 0.5 — ABNORMAL LOW (ref 1.1–2.2)
Albumin: 2.7 g/dL — ABNORMAL LOW (ref 3.5–5.2)
Alk Phosphatase: 171 U/L — ABNORMAL HIGH (ref 35–104)
Anion Gap: 10 mmol/L (ref 2–14)
BUN/Creatinine Ratio: 10 — ABNORMAL LOW (ref 12–20)
BUN: 5 mg/dL — ABNORMAL LOW (ref 6–20)
CO2: 28 mmol/L (ref 20–29)
Calcium: 9.6 mg/dL (ref 8.6–10.0)
Chloride: 98 mmol/L (ref 98–107)
Creatinine: 0.53 mg/dL — ABNORMAL LOW (ref 0.60–1.00)
Est, Glom Filt Rate: >90 ml/min/1.73m2 (ref >59)
Globulin: 5.3 g/dL — ABNORMAL HIGH (ref 2.0–4.0)
Glucose: 106 mg/dL — ABNORMAL HIGH (ref 65–100)
Potassium: 3.8 mmol/L (ref 3.5–5.1)
Sodium: 136 mmol/L (ref 136–145)
Total Bilirubin: 1.2 mg/dL (ref 0.0–1.2)
Total Protein: 8 g/dL (ref 6.4–8.3)

## 2024-05-26 LAB — LIPASE: Lipase: 22 U/L (ref 13–60)

## 2024-05-26 LAB — URINE CULTURE HOLD SAMPLE

## 2024-05-26 LAB — MAGNESIUM: Magnesium: 2 mg/dL (ref 1.6–2.6)

## 2024-05-26 MED ORDER — OXYCODONE HCL 5 MG PO TABS
5 | ORAL | Status: DC
Start: 2024-05-26 — End: 2024-05-26

## 2024-05-26 MED ORDER — IOPAMIDOL 76 % IV SOLN
76 | Freq: Once | INTRAVENOUS | Status: AC | PRN
Start: 2024-05-26 — End: 2024-05-26
  Administered 2024-05-26: 19:00:00 80 mL via INTRAVENOUS

## 2024-05-26 MED ORDER — SODIUM CHLORIDE 0.9 % IV BOLUS
0.9 | Freq: Once | INTRAVENOUS | Status: AC
Start: 2024-05-26 — End: 2024-05-26
  Administered 2024-05-26: 21:00:00 1000 mL via INTRAVENOUS

## 2024-05-26 MED ORDER — ONDANSETRON HCL 4 MG/2ML IJ SOLN
4 | Freq: Four times a day (QID) | INTRAMUSCULAR | Status: DC | PRN
Start: 2024-05-26 — End: 2024-05-26
  Administered 2024-05-26: 19:00:00 4 mg via INTRAVENOUS

## 2024-05-26 MED ORDER — HYDROMORPHONE 0.5MG/0.5ML IJ SOLN
1 | Status: DC
Start: 2024-05-26 — End: 2024-05-26

## 2024-05-26 MED ORDER — MORPHINE SULFATE (PF) 2 MG/ML IV SOLN
2 | INTRAVENOUS | Status: AC
Start: 2024-05-26 — End: 2024-05-26
  Administered 2024-05-26: 19:00:00 2 mg via INTRAVENOUS

## 2024-05-26 MED ORDER — METOCLOPRAMIDE HCL 5 MG/ML IJ SOLN
5 | Freq: Once | INTRAMUSCULAR | Status: AC
Start: 2024-05-26 — End: 2024-05-26
  Administered 2024-05-26: 21:00:00 10 mg via INTRAVENOUS

## 2024-05-26 MED ORDER — LACTATED RINGERS IV BOLUS
Freq: Once | INTRAVENOUS | Status: DC
Start: 2024-05-26 — End: 2024-05-26

## 2024-05-26 MED FILL — METOCLOPRAMIDE HCL 5 MG/ML IJ SOLN: 5 mg/mL | INTRAMUSCULAR | Qty: 2 | Fill #0

## 2024-05-26 MED FILL — ONDANSETRON HCL 4 MG/2ML IJ SOLN: 4 MG/2ML | INTRAMUSCULAR | Qty: 2 | Fill #0

## 2024-05-26 MED FILL — MORPHINE SULFATE 2 MG/ML IJ SOLN: 2 mg/mL | INTRAMUSCULAR | Qty: 1 | Fill #0

## 2024-05-26 MED FILL — ISOVUE-370 76 % IV SOLN: 76 % | INTRAVENOUS | Qty: 100 | Fill #0

## 2024-05-26 MED FILL — SODIUM CHLORIDE 0.9 % IV SOLN: 0.9 % | INTRAVENOUS | Qty: 1000 | Fill #0

## 2024-05-26 NOTE — ED Notes (Signed)
"  Patient signed out to me at 3p by Dr Roseann  33 y.o. female here with R breast pain and n/v.   Had recent procedure with surgery at [facility]. Has severe R breast pain. Labs ok, pending CT Chest    CT chest shows a right sided breast mass.  I evaluated the patient personally and performed a breast exam with RN Schuyler Ka at bedside.  She has 2 incisions, 1 of which has been sutured back together.  No active drainage.  The breast is slightly warm but mostly ecchymotic and firm compared to the left.  This is likely related to the mass and recent surgical procedure.  I do not appreciate any palpable/obviously drainable abscess at this point.  She tells me that she has had nausea and vomiting for 10 days ever since starting Bactrim.  She states that today she was at the clinic and she got so lightheaded from the vomiting and diarrhea (which has worsened over the weekend) that she was brought to the hospital.  She denies abdominal pain at this point.  She states her breast has not changed in appearance in 3 months.    Will give fluids, another round of antiemetics and pain meds.  Will attempt to consult her breast surgeon.      As of 6:29 PM her breast surgeon has not called me back.  Tentative plan is to let her go home as she is tolerating p.o.  I will change her antibiotics as it seems that the nausea and vomiting was provoked by the Bactrim. Giving clinda to give similar coverage to bactrim, allergic to PCN.   Will send on nausea medicines and a short course of pain meds.  She is to follow-up with her breast surgeon outpatient. Hcg neg    7:49 PM hcg neg. Pt feeling better. Discussed abnormal LFT with her. Again no RUQ abd pain. To follow up outpatient.    Eulas Schweitzer E Lylianna Fraiser, DO       Norell Brisbin E, DO  05/26/24 1950    "

## 2024-05-26 NOTE — Discharge Instructions (Addendum)
"  Today your liver tests with a bit abnormal. They were similar to prior labs. This could be related to vomiting, medications you are taking or underlying liver issues. Please follow up with your primary doctor    USE CAUTION TAKING THE PAIN MEDICATION AS IT CAN CAUSE DROWSINESS--NO DRIVING WHILE TAKING IT. DO NOT MIX IT WITH TYLENOL  AS IT HAS TYLENOL  IN IT    "

## 2024-05-26 NOTE — ED Provider Notes (Signed)
 "      [facility]  EMERGENCY DEPARTMENT ENCOUNTER      Pt Name: Wendy Bell  MRN: [id number]  Birthdate Nov 22, 1990  Date of evaluation: 05/26/2024  Provider: Clotilda JAYSON Pais, MD    CHIEF COMPLAINT       Chief Complaint   Patient presents with    Breast Pain         HISTORY OF PRESENT ILLNESS   (Location/Symptom, Timing/Onset, Context/Setting, Quality, Duration, Modifying Factors, Severity)  Note limiting factors.   The history is provided by the patient.     33 year old female history of ADHD, asthma presenting for breast pain.  Reports she has had a breast mass for 3 months, was seen at [facility] on [facility], reports that they did a procedure on her to drainage.  Reports that today she was not feeling well after her clinic appointment and they called an ambulance because she was feeling sweaty and nauseous, is concerned for postop complication from her breast procedure.  Reports that she asked to go to [facility] but was brought here instead, reports she is not sure the name of her surgeon, states her name is Wendy Bell.Pt has been prescribed antibiotics for her breast abscess, rx for bactrim.     Review of External Medical Records:   Chart review shows visit with [facility] surgical oncology on 05/23/2024  05/23/2024 12:30 PM EST   The right breast mass with fistulous communication to the right nipple appears to have enlarged since the most reason sonographic evaluation performed 05/19/2024.  Given the complexity of the collection, surgical follow up is recommended for consideration of incision and drainage.  The patient has an appointment with Dr. Holli this afternoon. Sonographic follow up may be performed as clinically indicated.  Follow up right diagnostic mammogram and right axillary ultrasound are recommended in 3-6 months, following resolution of symptoms, to document resolution of the right mammographic findings and normalization of right axillary lymph nodes.    Results and recommendations  were discussed with patient at the conclusion of imaging.  Findings and recommendations were relayed to Dr. Holli via Epic Staff message.    BI-RADS: 3. Probably Benign      Nursing Notes were reviewed.      REVIEW OF SYSTEMS    (2-9 systems for level 4, 10 or more for level 5)     Except as noted above the remainder of the review of systems was reviewed and negative.       PAST MEDICAL HISTORY     Past Medical History:   Diagnosis Date    ADHD (attention deficit hyperactivity disorder) 2010    not currently taking medication    Asthma     Intracranial hypertension          SURGICAL HISTORY     No past surgical history on file.      CURRENT MEDICATIONS       Previous Medications    FAMOTIDINE (PEPCID) 20 MG TABLET    Take 20 mg by mouth 2 times daily    ONDANSETRON  (ZOFRAN -ODT) 4 MG DISINTEGRATING TABLET    Take 4 mg by mouth every 8 hours as needed       ALLERGIES     Hydrocodone-acetaminophen , Penicillins, Shellfish allergy, and Tramadol    FAMILY HISTORY       Family History   Problem Relation Age of Onset    Asthma Brother         bronnchitis  Asthma Sister     Sickle Cell Anemia Mother     Asthma Sister     Hypertension Maternal Grandmother     Kidney Disease Maternal Grandfather     Hypertension Maternal Grandfather     Asthma Maternal Grandmother     Breast Cancer Maternal Grandmother         breast ca    Asthma Sister           SOCIAL HISTORY       Social History     Socioeconomic History    Marital status: Single   Tobacco Use    Smoking status: Some Days    Smokeless tobacco: Never   Substance and Sexual Activity    Alcohol use: No    Drug use: No           PHYSICAL EXAM    (up to 7 for level 4, 8 or more for level 5)     ED Triage Vitals [05/26/24 1220]   BP Girls Systolic BP Percentile Girls Diastolic BP Percentile Boys Systolic BP Percentile Boys Diastolic BP Percentile Temp Temp Source Pulse   104/71 -- -- -- -- 98.7 F (37.1 C) Oral 97      Respirations SpO2 Height Weight       18 98 % 1.676 m  (5' 6) --           There is no height or weight on file to calculate BMI.    Physical Exam  Vitals reviewed.   Constitutional:       General: She is not in acute distress.     Appearance: Normal appearance. She is not ill-appearing or toxic-appearing.   HENT:      Head: Normocephalic and atraumatic.      Nose: Nose normal.   Eyes:      Conjunctiva/sclera: Conjunctivae normal.   Cardiovascular:      Rate and Rhythm: Normal rate.      Heart sounds: Normal heart sounds.   Pulmonary:      Effort: Pulmonary effort is normal. No respiratory distress.      Breath sounds: Normal breath sounds. No stridor. No wheezing, rhonchi or rales.   Abdominal:      General: Abdomen is flat. There is no distension.      Palpations: Abdomen is soft.      Tenderness: There is no abdominal tenderness. There is no guarding or rebound.   Musculoskeletal:      Cervical back: Neck supple.      Right lower leg: No edema.      Left lower leg: No edema.   Skin:     General: Skin is warm and dry.      Findings: Erythema and lesion present.      Comments: R breast with notable erythema and tenderness to palpation, small 2 cm laceration (pt reports is I&D site) on R breast at 5 oclock and smaller laceration sutured back togetther at [facility] on R breast    Neurological:      Mental Status: She is alert. Mental status is at baseline.   Psychiatric:         Mood and Affect: Mood normal.         Behavior: Behavior normal.           DIAGNOSTIC RESULTS     EKG: All EKG's are interpreted by the Emergency Department Physician who either signs or Co-signs this chart in the absence of a cardiologist.  RADIOLOGY:   Non-plain film images such as CT, Ultrasound and MRI are read by the radiologist. Plain radiographic images are visualized and preliminarily interpreted by the emergency physician as documented in ED course.      Interpretation per the Radiologist below, if available at the time of this note:    No orders to display        LABS:  Labs  Reviewed   EXTRA TUBES HOLD   CBC WITH AUTO DIFFERENTIAL   COMPREHENSIVE METABOLIC PANEL       All other labs were within normal range or not returned as of this dictation.    EMERGENCY DEPARTMENT COURSE and DIFFERENTIAL DIAGNOSIS/MDM:   Vitals:    Vitals:    05/26/24 1220 05/26/24 1246   BP: 104/71 124/75   Pulse: 97    Resp: 18    Temp: 98.7 F (37.1 C)    TempSrc: Oral    SpO2: 98% 96%   Height: 1.676 m (5' 6)            Medical Decision Making  Pt presenting for breast pain, recent OSH evaluation for breast mass and possible abscess, pt reports worsening pain, swelling, and now today feeling chills and near syncope, sent from clinic for nausea and faintness  Pt afebrile, hemodynamically stable on arrival, no tachycardia, no hypotension, normal WOB, R breast with redness and swelling, concerning for cellulitis with abscess  Labs with normal WBC, no evidence of Sepsis.   CT breast pending to further evaluate  Pt given antiemetics will need re-eval and PO challenge     3 PM  Change of shift.  Care of patient signed over to [facility].  Bedside handoff complete. Awaiting CT breast and re-eval.         Amount and/or Complexity of Data Reviewed  Labs: ordered. Decision-making details documented in ED Course.  Radiology: ordered.    Risk  Prescription drug management.        Please note: This chart was created using medical dictation software. Although efforts were made to correct errors, please be aware of potential sound-alike names and dictations errors.         REASSESSMENT     ED Course as of 05/27/24 1513   Mon May 26, 2024   1426 WBC: 9.0 [SL]   1426 Hemoglobin Quant: 11.5 [SL]   1426 Sodium: 136 [SL]   1426 Potassium: 3.8 [SL]   1426 Chloride: 98 [SL]   1426 Creatinine(!): 0.53 [SL]   1426 Total Bilirubin: 1.2 [SL]   1426 ALT(!): 109 [SL]   1426 AST(!): 89 [SL]   1426 Alkaline Phosphatase(!): 171 [SL]   1534 Transaminitis seem sto be somewhat chronic [CC]      ED Course User Index  [CC] Crenshaw, Courtland E,  DO  [SL] Roseann Clotilda BROCKS, MD           CONSULTS:  None    PROCEDURES:  Unless otherwise noted below, none     Procedures          FINAL IMPRESSION    No diagnosis found.        DISPOSITION/PLAN   DISPOSITION        PATIENT REFERRED TO:  No follow-up provider specified.    DISCHARGE MEDICATIONS:  New Prescriptions    No medications on file         (Please note that portions of this note were completed with a voice recognition program.  Efforts were made to edit the  dictations but occasionally words are mis-transcribed.)    Clotilda JAYSON Pais, MD (electronically signed)  Emergency Attending Physician               Pais Clotilda JAYSON, MD  05/27/24 [phone]    "

## 2024-05-26 NOTE — ED Triage Notes (Signed)
"  Pt bibems with complaints of right breast mass for the last 3 months was seen at [facility] to drain today. Only blood produced from drainage and pt became lightheaded and diaphoretic. Admin 100 FENt and 4 Zofran  in route. Has been vomiting daily since prescribed bactrim. ,  "

## 2024-05-26 NOTE — ED Notes (Signed)
"  Pt given crackers and ginger ale for PO challenge. Pt passed.   "

## 2024-05-27 LAB — URINALYSIS WITH MICROSCOPIC
BACTERIA, URINE: NEGATIVE /HPF
Blood, Urine: NEGATIVE
Glucose, Ur: NEGATIVE mg/dL
Ketones, Urine: 15 mg/dL — AB
Leukocyte Esterase, Urine: NEGATIVE
Nitrite, Urine: NEGATIVE
Protein, UA: NEGATIVE mg/dL
Specific Gravity, UA: >1.03 — ABNORMAL HIGH (ref 1.003–1.030)
Urobilinogen, Urine: 4 EU/dL — ABNORMAL HIGH (ref 0.2–1.0)
pH, Urine: 7 (ref 5.0–8.0)

## 2024-05-27 LAB — POC PREGNANCY UR-QUAL: Preg Test, Ur: NEGATIVE

## 2024-05-27 LAB — BILIRUBIN, CONFIRMATORY: Bilirubin Confirmation, UA: NEGATIVE

## 2024-05-27 MED ORDER — OXYCODONE-ACETAMINOPHEN 5-325 MG PO TABS
5-325 | ORAL_TABLET | Freq: Four times a day (QID) | ORAL | 0 refills | 5.00000 days | Status: AC | PRN
Start: 2024-05-27 — End: 2024-05-28

## 2024-05-27 MED ORDER — CLINDAMYCIN HCL 300 MG PO CAPS
300 | ORAL_CAPSULE | Freq: Three times a day (TID) | ORAL | 0 refills | 7.00000 days | Status: AC
Start: 2024-05-27 — End: 2024-05-29

## 2024-05-27 MED ORDER — ONDANSETRON 4 MG PO TBDP
4 | ORAL_TABLET | Freq: Three times a day (TID) | ORAL | 0 refills | 7.00000 days | Status: AC | PRN
Start: 2024-05-27 — End: ?
